# Patient Record
Sex: Female | Born: 1945 | Race: White | Hispanic: No | Marital: Single | Smoking: Former smoker
Health system: Southern US, Community
[De-identification: ages and names within clinical notes are randomized; demographics above are authoritative.]

## PROBLEM LIST (undated history)

## (undated) DIAGNOSIS — F909 Attention-deficit hyperactivity disorder, unspecified type: Secondary | ICD-10-CM

## (undated) DIAGNOSIS — E039 Hypothyroidism, unspecified: Secondary | ICD-10-CM

## (undated) DIAGNOSIS — F4310 Post-traumatic stress disorder, unspecified: Secondary | ICD-10-CM

## (undated) DIAGNOSIS — D649 Anemia, unspecified: Secondary | ICD-10-CM

## (undated) DIAGNOSIS — G709 Myoneural disorder, unspecified: Secondary | ICD-10-CM

## (undated) DIAGNOSIS — E781 Pure hyperglyceridemia: Secondary | ICD-10-CM

## (undated) DIAGNOSIS — E079 Disorder of thyroid, unspecified: Secondary | ICD-10-CM

## (undated) DIAGNOSIS — C801 Malignant (primary) neoplasm, unspecified: Secondary | ICD-10-CM

## (undated) DIAGNOSIS — F32A Depression, unspecified: Secondary | ICD-10-CM

## (undated) DIAGNOSIS — B019 Varicella without complication: Secondary | ICD-10-CM

## (undated) DIAGNOSIS — F419 Anxiety disorder, unspecified: Secondary | ICD-10-CM

## (undated) DIAGNOSIS — G4730 Sleep apnea, unspecified: Secondary | ICD-10-CM

## (undated) DIAGNOSIS — T7840XA Allergy, unspecified, initial encounter: Secondary | ICD-10-CM

## (undated) DIAGNOSIS — G629 Polyneuropathy, unspecified: Secondary | ICD-10-CM

## (undated) DIAGNOSIS — M1990 Unspecified osteoarthritis, unspecified site: Secondary | ICD-10-CM

## (undated) DIAGNOSIS — I10 Essential (primary) hypertension: Secondary | ICD-10-CM

## (undated) DIAGNOSIS — R4189 Other symptoms and signs involving cognitive functions and awareness: Secondary | ICD-10-CM

## (undated) DIAGNOSIS — H269 Unspecified cataract: Secondary | ICD-10-CM

## (undated) DIAGNOSIS — C50919 Malignant neoplasm of unspecified site of unspecified female breast: Secondary | ICD-10-CM

## (undated) DIAGNOSIS — F329 Major depressive disorder, single episode, unspecified: Secondary | ICD-10-CM

## (undated) HISTORY — DX: Depression, unspecified: F32.A

## (undated) HISTORY — PX: TONSILLECTOMY: SUR1361

## (undated) HISTORY — DX: Pure hyperglyceridemia: E78.1

## (undated) HISTORY — DX: Malignant (primary) neoplasm, unspecified: C80.1

## (undated) HISTORY — DX: Major depressive disorder, single episode, unspecified: F32.9

## (undated) HISTORY — DX: Unspecified cataract: H26.9

## (undated) HISTORY — DX: Varicella without complication: B01.9

## (undated) HISTORY — PX: EYE SURGERY: SHX253

## (undated) HISTORY — DX: Essential (primary) hypertension: I10

## (undated) HISTORY — DX: Myoneural disorder, unspecified: G70.9

## (undated) HISTORY — DX: Malignant neoplasm of unspecified site of unspecified female breast: C50.919

## (undated) HISTORY — DX: Disorder of thyroid, unspecified: E07.9

## (undated) HISTORY — DX: Polyneuropathy, unspecified: G62.9

## (undated) HISTORY — DX: Anemia, unspecified: D64.9

## (undated) HISTORY — DX: Unspecified osteoarthritis, unspecified site: M19.90

## (undated) HISTORY — DX: Sleep apnea, unspecified: G47.30

## (undated) HISTORY — PX: COLONOSCOPY: SHX174

## (undated) HISTORY — PX: CATARACT EXTRACTION W/ INTRAOCULAR LENS  IMPLANT, BILATERAL: SHX1307

## (undated) HISTORY — DX: Allergy, unspecified, initial encounter: T78.40XA

---

## 1972-10-07 HISTORY — PX: THYROIDECTOMY, PARTIAL: SHX18

## 2006-01-05 DIAGNOSIS — G4730 Sleep apnea, unspecified: Secondary | ICD-10-CM | POA: Insufficient documentation

## 2014-10-07 DIAGNOSIS — G3184 Mild cognitive impairment, so stated: Secondary | ICD-10-CM | POA: Insufficient documentation

## 2014-11-06 DIAGNOSIS — M8580 Other specified disorders of bone density and structure, unspecified site: Secondary | ICD-10-CM | POA: Diagnosis not present

## 2014-11-06 DIAGNOSIS — M419 Scoliosis, unspecified: Secondary | ICD-10-CM | POA: Diagnosis not present

## 2014-11-06 DIAGNOSIS — Z862 Personal history of diseases of the blood and blood-forming organs and certain disorders involving the immune mechanism: Secondary | ICD-10-CM | POA: Diagnosis not present

## 2014-11-06 DIAGNOSIS — I10 Essential (primary) hypertension: Secondary | ICD-10-CM | POA: Diagnosis not present

## 2014-11-09 DIAGNOSIS — F332 Major depressive disorder, recurrent severe without psychotic features: Secondary | ICD-10-CM | POA: Diagnosis not present

## 2014-11-09 DIAGNOSIS — F411 Generalized anxiety disorder: Secondary | ICD-10-CM | POA: Diagnosis not present

## 2014-11-09 DIAGNOSIS — F900 Attention-deficit hyperactivity disorder, predominantly inattentive type: Secondary | ICD-10-CM | POA: Diagnosis not present

## 2014-12-05 DIAGNOSIS — M8580 Other specified disorders of bone density and structure, unspecified site: Secondary | ICD-10-CM | POA: Diagnosis not present

## 2014-12-05 DIAGNOSIS — M419 Scoliosis, unspecified: Secondary | ICD-10-CM | POA: Diagnosis not present

## 2014-12-05 DIAGNOSIS — Z862 Personal history of diseases of the blood and blood-forming organs and certain disorders involving the immune mechanism: Secondary | ICD-10-CM | POA: Diagnosis not present

## 2014-12-05 DIAGNOSIS — I10 Essential (primary) hypertension: Secondary | ICD-10-CM | POA: Diagnosis not present

## 2015-01-03 DIAGNOSIS — M722 Plantar fascial fibromatosis: Secondary | ICD-10-CM | POA: Diagnosis not present

## 2015-01-03 DIAGNOSIS — M7740 Metatarsalgia, unspecified foot: Secondary | ICD-10-CM | POA: Diagnosis not present

## 2015-01-03 DIAGNOSIS — M216X1 Other acquired deformities of right foot: Secondary | ICD-10-CM | POA: Diagnosis not present

## 2015-01-03 DIAGNOSIS — M216X2 Other acquired deformities of left foot: Secondary | ICD-10-CM | POA: Diagnosis not present

## 2015-01-05 DIAGNOSIS — I10 Essential (primary) hypertension: Secondary | ICD-10-CM | POA: Diagnosis not present

## 2015-01-11 DIAGNOSIS — F411 Generalized anxiety disorder: Secondary | ICD-10-CM | POA: Diagnosis not present

## 2015-01-11 DIAGNOSIS — F332 Major depressive disorder, recurrent severe without psychotic features: Secondary | ICD-10-CM | POA: Diagnosis not present

## 2015-02-04 DIAGNOSIS — M8580 Other specified disorders of bone density and structure, unspecified site: Secondary | ICD-10-CM | POA: Diagnosis not present

## 2015-02-04 DIAGNOSIS — I10 Essential (primary) hypertension: Secondary | ICD-10-CM | POA: Diagnosis not present

## 2015-03-07 DIAGNOSIS — M8580 Other specified disorders of bone density and structure, unspecified site: Secondary | ICD-10-CM | POA: Diagnosis not present

## 2015-03-07 DIAGNOSIS — I10 Essential (primary) hypertension: Secondary | ICD-10-CM | POA: Diagnosis not present

## 2015-04-06 DIAGNOSIS — I10 Essential (primary) hypertension: Secondary | ICD-10-CM | POA: Diagnosis not present

## 2015-04-06 DIAGNOSIS — M8580 Other specified disorders of bone density and structure, unspecified site: Secondary | ICD-10-CM | POA: Diagnosis not present

## 2015-04-07 DIAGNOSIS — F411 Generalized anxiety disorder: Secondary | ICD-10-CM | POA: Diagnosis not present

## 2015-04-07 DIAGNOSIS — F900 Attention-deficit hyperactivity disorder, predominantly inattentive type: Secondary | ICD-10-CM | POA: Diagnosis not present

## 2015-04-07 DIAGNOSIS — F332 Major depressive disorder, recurrent severe without psychotic features: Secondary | ICD-10-CM | POA: Diagnosis not present

## 2015-05-05 DIAGNOSIS — M8580 Other specified disorders of bone density and structure, unspecified site: Secondary | ICD-10-CM | POA: Diagnosis not present

## 2015-05-05 DIAGNOSIS — I10 Essential (primary) hypertension: Secondary | ICD-10-CM | POA: Diagnosis not present

## 2015-06-07 DIAGNOSIS — M8580 Other specified disorders of bone density and structure, unspecified site: Secondary | ICD-10-CM | POA: Diagnosis not present

## 2015-06-07 DIAGNOSIS — I10 Essential (primary) hypertension: Secondary | ICD-10-CM | POA: Diagnosis not present

## 2015-06-29 DIAGNOSIS — M5116 Intervertebral disc disorders with radiculopathy, lumbar region: Secondary | ICD-10-CM | POA: Diagnosis not present

## 2015-06-29 DIAGNOSIS — Z6822 Body mass index (BMI) 22.0-22.9, adult: Secondary | ICD-10-CM | POA: Diagnosis not present

## 2015-06-29 DIAGNOSIS — E782 Mixed hyperlipidemia: Secondary | ICD-10-CM | POA: Diagnosis not present

## 2015-06-29 DIAGNOSIS — R202 Paresthesia of skin: Secondary | ICD-10-CM | POA: Diagnosis not present

## 2015-06-29 DIAGNOSIS — Z0001 Encounter for general adult medical examination with abnormal findings: Secondary | ICD-10-CM | POA: Diagnosis not present

## 2015-06-29 DIAGNOSIS — Z23 Encounter for immunization: Secondary | ICD-10-CM | POA: Diagnosis not present

## 2015-06-29 DIAGNOSIS — G4730 Sleep apnea, unspecified: Secondary | ICD-10-CM | POA: Diagnosis not present

## 2015-06-29 DIAGNOSIS — I10 Essential (primary) hypertension: Secondary | ICD-10-CM | POA: Diagnosis not present

## 2015-07-06 DIAGNOSIS — F332 Major depressive disorder, recurrent severe without psychotic features: Secondary | ICD-10-CM | POA: Diagnosis not present

## 2015-07-06 DIAGNOSIS — F411 Generalized anxiety disorder: Secondary | ICD-10-CM | POA: Diagnosis not present

## 2015-07-06 DIAGNOSIS — F900 Attention-deficit hyperactivity disorder, predominantly inattentive type: Secondary | ICD-10-CM | POA: Diagnosis not present

## 2015-07-07 DIAGNOSIS — E782 Mixed hyperlipidemia: Secondary | ICD-10-CM | POA: Diagnosis not present

## 2015-07-07 DIAGNOSIS — F411 Generalized anxiety disorder: Secondary | ICD-10-CM | POA: Diagnosis not present

## 2015-07-07 DIAGNOSIS — I10 Essential (primary) hypertension: Secondary | ICD-10-CM | POA: Diagnosis not present

## 2015-07-26 DIAGNOSIS — Z6821 Body mass index (BMI) 21.0-21.9, adult: Secondary | ICD-10-CM | POA: Diagnosis not present

## 2015-07-26 DIAGNOSIS — I10 Essential (primary) hypertension: Secondary | ICD-10-CM | POA: Diagnosis not present

## 2015-08-07 DIAGNOSIS — E782 Mixed hyperlipidemia: Secondary | ICD-10-CM | POA: Diagnosis not present

## 2015-08-07 DIAGNOSIS — F411 Generalized anxiety disorder: Secondary | ICD-10-CM | POA: Diagnosis not present

## 2015-08-07 DIAGNOSIS — I10 Essential (primary) hypertension: Secondary | ICD-10-CM | POA: Diagnosis not present

## 2015-08-10 DIAGNOSIS — F900 Attention-deficit hyperactivity disorder, predominantly inattentive type: Secondary | ICD-10-CM | POA: Diagnosis not present

## 2015-08-10 DIAGNOSIS — F332 Major depressive disorder, recurrent severe without psychotic features: Secondary | ICD-10-CM | POA: Diagnosis not present

## 2015-08-10 DIAGNOSIS — F411 Generalized anxiety disorder: Secondary | ICD-10-CM | POA: Diagnosis not present

## 2015-09-06 DIAGNOSIS — F411 Generalized anxiety disorder: Secondary | ICD-10-CM | POA: Diagnosis not present

## 2015-09-06 DIAGNOSIS — I10 Essential (primary) hypertension: Secondary | ICD-10-CM | POA: Diagnosis not present

## 2015-09-06 DIAGNOSIS — E782 Mixed hyperlipidemia: Secondary | ICD-10-CM | POA: Diagnosis not present

## 2015-09-07 DIAGNOSIS — F4310 Post-traumatic stress disorder, unspecified: Secondary | ICD-10-CM | POA: Insufficient documentation

## 2015-09-20 DIAGNOSIS — F411 Generalized anxiety disorder: Secondary | ICD-10-CM | POA: Diagnosis not present

## 2015-09-20 DIAGNOSIS — F332 Major depressive disorder, recurrent severe without psychotic features: Secondary | ICD-10-CM | POA: Diagnosis not present

## 2015-09-20 DIAGNOSIS — F900 Attention-deficit hyperactivity disorder, predominantly inattentive type: Secondary | ICD-10-CM | POA: Diagnosis not present

## 2015-11-08 DIAGNOSIS — G4730 Sleep apnea, unspecified: Secondary | ICD-10-CM | POA: Diagnosis not present

## 2015-11-08 DIAGNOSIS — E782 Mixed hyperlipidemia: Secondary | ICD-10-CM | POA: Diagnosis not present

## 2015-11-08 DIAGNOSIS — I10 Essential (primary) hypertension: Secondary | ICD-10-CM | POA: Diagnosis not present

## 2015-11-22 DIAGNOSIS — H35379 Puckering of macula, unspecified eye: Secondary | ICD-10-CM | POA: Diagnosis not present

## 2015-12-05 DIAGNOSIS — I10 Essential (primary) hypertension: Secondary | ICD-10-CM | POA: Diagnosis not present

## 2015-12-05 DIAGNOSIS — F411 Generalized anxiety disorder: Secondary | ICD-10-CM | POA: Diagnosis not present

## 2015-12-05 DIAGNOSIS — E782 Mixed hyperlipidemia: Secondary | ICD-10-CM | POA: Diagnosis not present

## 2015-12-14 DIAGNOSIS — F332 Major depressive disorder, recurrent severe without psychotic features: Secondary | ICD-10-CM | POA: Diagnosis not present

## 2015-12-14 DIAGNOSIS — F411 Generalized anxiety disorder: Secondary | ICD-10-CM | POA: Diagnosis not present

## 2015-12-14 DIAGNOSIS — F900 Attention-deficit hyperactivity disorder, predominantly inattentive type: Secondary | ICD-10-CM | POA: Diagnosis not present

## 2016-01-05 DIAGNOSIS — F411 Generalized anxiety disorder: Secondary | ICD-10-CM | POA: Diagnosis not present

## 2016-01-05 DIAGNOSIS — I10 Essential (primary) hypertension: Secondary | ICD-10-CM | POA: Diagnosis not present

## 2016-01-05 DIAGNOSIS — E782 Mixed hyperlipidemia: Secondary | ICD-10-CM | POA: Diagnosis not present

## 2016-02-04 DIAGNOSIS — I10 Essential (primary) hypertension: Secondary | ICD-10-CM | POA: Diagnosis not present

## 2016-02-04 DIAGNOSIS — F411 Generalized anxiety disorder: Secondary | ICD-10-CM | POA: Diagnosis not present

## 2016-02-04 DIAGNOSIS — E782 Mixed hyperlipidemia: Secondary | ICD-10-CM | POA: Diagnosis not present

## 2016-02-20 ENCOUNTER — Encounter: Payer: Self-pay | Admitting: Internal Medicine

## 2016-02-20 ENCOUNTER — Ambulatory Visit (INDEPENDENT_AMBULATORY_CARE_PROVIDER_SITE_OTHER): Payer: Medicare Other | Admitting: Internal Medicine

## 2016-02-20 VITALS — BP 108/64 | HR 61 | Temp 97.7°F | Ht 60.0 in | Wt 112.8 lb

## 2016-02-20 DIAGNOSIS — G629 Polyneuropathy, unspecified: Secondary | ICD-10-CM | POA: Insufficient documentation

## 2016-02-20 DIAGNOSIS — G4700 Insomnia, unspecified: Secondary | ICD-10-CM

## 2016-02-20 DIAGNOSIS — F909 Attention-deficit hyperactivity disorder, unspecified type: Secondary | ICD-10-CM | POA: Diagnosis not present

## 2016-02-20 DIAGNOSIS — F32A Depression, unspecified: Secondary | ICD-10-CM | POA: Insufficient documentation

## 2016-02-20 DIAGNOSIS — E785 Hyperlipidemia, unspecified: Secondary | ICD-10-CM | POA: Diagnosis not present

## 2016-02-20 DIAGNOSIS — I10 Essential (primary) hypertension: Secondary | ICD-10-CM

## 2016-02-20 DIAGNOSIS — F988 Other specified behavioral and emotional disorders with onset usually occurring in childhood and adolescence: Secondary | ICD-10-CM

## 2016-02-20 DIAGNOSIS — F329 Major depressive disorder, single episode, unspecified: Secondary | ICD-10-CM | POA: Insufficient documentation

## 2016-02-20 DIAGNOSIS — J302 Other seasonal allergic rhinitis: Secondary | ICD-10-CM

## 2016-02-20 DIAGNOSIS — F418 Other specified anxiety disorders: Secondary | ICD-10-CM

## 2016-02-20 DIAGNOSIS — F419 Anxiety disorder, unspecified: Secondary | ICD-10-CM

## 2016-02-20 DIAGNOSIS — G588 Other specified mononeuropathies: Secondary | ICD-10-CM

## 2016-02-20 MED ORDER — EZETIMIBE 10 MG PO TABS: 10.0000 mg | ORAL_TABLET | Freq: Every day | ORAL | Status: DC

## 2016-02-20 MED ORDER — LISINOPRIL 20 MG PO TABS: 20.0000 mg | ORAL_TABLET | Freq: Every day | ORAL | Status: DC

## 2016-02-20 NOTE — Assessment & Plan Note (Signed)
Will get recent Lipid profile from previous PCP Continue Zetia, Omega 3 Encouraged her to consume a low fat diet
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
## 2016-02-20 NOTE — Assessment & Plan Note (Signed)
She wants to continue Metanx If persist, we can refer her back to neurology in the area

## 2016-02-20 NOTE — Progress Notes (Signed)
Pre visit review using our clinic review tool, if applicable. No additional management support is needed unless otherwise documented below in the visit note. 

## 2016-02-20 NOTE — Assessment & Plan Note (Signed)
Will request previous records from psych before filling Adderall

## 2016-02-20 NOTE — Assessment & Plan Note (Signed)
Continue Allegra and [employer]

## 2016-02-20 NOTE — Assessment & Plan Note (Signed)
Advised her to continue Trazadone prn

## 2016-02-20 NOTE — Assessment & Plan Note (Signed)
Controlled on Buspar Seems to be adjusting well here Will continue to monitor

## 2016-02-20 NOTE — Patient Instructions (Signed)

## 2016-02-20 NOTE — Assessment & Plan Note (Signed)
Well controlled on Lisinopril Consider wean if she remains on the low end

## 2016-03-01 ENCOUNTER — Telehealth: Payer: Self-pay

## 2016-03-01 ENCOUNTER — Ambulatory Visit (HOSPITAL_COMMUNITY)
Admission: EM | Admit: 2016-03-01 | Discharge: 2016-03-01 | Disposition: A | Discharge status: Home / Self Care | Payer: Medicare Other | Attending: Emergency Medicine | Admitting: Emergency Medicine

## 2016-03-01 ENCOUNTER — Encounter (HOSPITAL_COMMUNITY): Payer: Self-pay | Admitting: *Deleted

## 2016-03-01 ENCOUNTER — Ambulatory Visit (INDEPENDENT_AMBULATORY_CARE_PROVIDER_SITE_OTHER): Payer: Medicare Other

## 2016-03-01 ENCOUNTER — Telehealth: Payer: Self-pay | Admitting: Primary Care

## 2016-03-01 DIAGNOSIS — M79672 Pain in left foot: Secondary | ICD-10-CM

## 2016-03-01 DIAGNOSIS — S93602A Unspecified sprain of left foot, initial encounter: Secondary | ICD-10-CM

## 2016-03-01 DIAGNOSIS — M7989 Other specified soft tissue disorders: Secondary | ICD-10-CM | POA: Diagnosis not present

## 2016-03-01 NOTE — Telephone Encounter (Signed)
Pt has plans to go out of town 03/02/16 and does not want to go to [facility]. Pt request contact # for [facility]; gave pt [phone]. Pt said she most likely will go to [facility] this evening.

## 2016-03-01 NOTE — Discharge Instructions (Signed)
Foot Sprain  The xrays look good. Your exam suggest mild sprain. Suggest elevation and icing about 20 min every 2-3 hours. Wear your boot for the next week, when you are up and on the foot. Not to bed. If you are not improving then please f/u with Dr. Doran Durand. May use Aleve 2 tablets in am and 2 in pm for the next few days for pain and inflammation. Hope it feels better soon.   A foot sprain is an injury to one of the strong bands of tissue (ligaments) that connect and support the many bones in your feet. The ligament can be stretched too much or it can tear. A tear can be either partial or complete. The severity of the sprain depends on how much of the ligament was damaged or torn. CAUSES A foot sprain is usually caused by suddenly twisting or pivoting your foot. RISK FACTORS This injury is more likely to occur in people who:  Play a sport, such as basketball or football.  Exercise or play a sport without warming up.  Start a new workout or sport.  Suddenly increase how long or hard they exercise or play a sport. SYMPTOMS Symptoms of this condition start soon after an injury and include:  Pain, especially in the arch of the foot.  Bruising.  Swelling.  Inability to walk or use the foot to support body weight. DIAGNOSIS This condition is diagnosed with a medical history and physical exam. You may also have imaging tests, such as:  X-rays to make sure there are no broken bones (fractures).  MRI to see if the ligament has torn. TREATMENT Treatment varies depending on the severity of your sprain. Mild sprains can be treated with rest, ice, compression, and elevation (RICE). If your ligament is overstretched or partially torn, treatment usually involves keeping your foot in a fixed position (immobilization) for a period of time. To help you do this, your health care provider will apply a bandage, splint, or walking boot to keep your foot from moving until it heals. You may also be  advised to use crutches or a scooter for a few weeks to avoid bearing weight on your foot while it is healing. If your ligament is fully torn, you may need surgery to reconnect the ligament to the bone. After surgery, a cast or splint will be applied and will need to stay on your foot while it heals. Your health care provider may also suggest exercises or physical therapy to strengthen your foot. HOME CARE INSTRUCTIONS If You Have a Bandage, Splint, or Walking Boot:  Wear it as directed by your health care provider. Remove it only as directed by your health care provider.  Loosen the bandage, splint, or walking boot if your toes become numb and tingle, or if they turn cold and blue. Bathing  If your health care provider approves bathing and showering, cover the bandage or splint with a watertight plastic bag to protect it from water. Do not let the bandage or splint get wet. Managing Pain, Stiffness, and Swelling   If directed, apply ice to the injured area:  Put ice in a plastic bag.  Place a towel between your skin and the bag.  Leave the ice on for 20 minutes, 2-3 times per day.  Move your toes often to avoid stiffness and to lessen swelling.  Raise (elevate) the injured area above the level of your heart while you are sitting or lying down. Driving  Do not drive or  operate heavy machinery while taking pain medicine.  Do not drive while wearing a bandage, splint, or walking boot on a foot that you use for driving. Activity  Rest as directed by your health care provider.  Do not use the injured foot to support your body weight until your health care provider says that you can. Use crutches or other supportive devices as directed by your health care provider.  Ask your health care provider what activities are safe for you. Gradually increase how much and how far you walk until your health care provider says it is safe to return to full activity.  Do any exercise or physical  therapy as directed by your health care provider. General Instructions  If a splint was applied, do not put pressure on any part of it until it is fully hardened. This may take several hours.  Take medicines only as directed by your health care provider. These include over-the-counter medicines and prescription medicines.  Keep all follow-up visits as directed by your health care provider. This is important.  When you can walk without pain, wear supportive shoes that have stiff soles. Do not wear flip-flops, and do not walk barefoot. SEEK MEDICAL CARE IF:  Your pain is not controlled with medicine.  Your bruising or swelling gets worse or does not get better with treatment.  Your splint or walking boot is damaged. SEEK IMMEDIATE MEDICAL CARE IF:  Your foot is numb or blue.  Your foot feels colder than normal.   This information is not intended to replace advice given to you by your health care provider. Make sure you discuss any questions you have with your health care provider.   Document Released: 03/15/2002 Document Revised: 02/07/2015 Document Reviewed: 07/27/2014 Elsevier Interactive Patient Education [employer].

## 2016-03-01 NOTE — ED Provider Notes (Signed)
CSN: [id number]     Arrival date & time 03/01/16  1739 History   First MD Initiated Contact with Patient 03/01/16 1755     Chief Complaint  Patient presents with  . Foot Pain   (Consider location/radiation/quality/duration/timing/severity/associated sxs/prior Treatment) HPI Comments: Patient is a 70 yo female who presents with left foot pain since walking on the treadmill last night. She carries a history of peripheral neuropathy and therefore her sensation in her feet are diminished. She reports sudden pain along the forefoot that has no improved today. No noted swelling. No erythema or warmth. No ankle pain. Pain with ambulation.   Patient is a 70 y.o. female presenting with lower extremity pain. The history is provided by the patient.  Foot Pain    Past Medical History  Diagnosis Date  . Hypertriglyceridemia   . Chicken pox   . Depression   . Hypertension    Past Surgical History  Procedure Laterality Date  . Thyroidectomy, partial  1974   Family History  Problem Relation Age of Onset  . Arthritis Mother   . Heart disease Mother   . Heart disease Father   . Mental illness Maternal Uncle   . Arthritis Maternal Grandmother   . Heart disease Maternal Grandmother   . Alcohol abuse Maternal Grandfather   . Heart disease Maternal Grandfather   . Heart disease Paternal Grandmother   . Heart disease Paternal Grandfather    Social History  Substance Use Topics  . Smoking status: Never Smoker   . Smokeless tobacco: None  . Alcohol Use: No   OB History    No data available     Review of Systems  Skin: Negative for color change.    Allergies  Review of patient's allergies indicates no known allergies.  Home Medications   Prior to Admission medications   Medication Sig Start Date End Date Taking? Authorizing Provider  Alpha-Lipoic Acid 600 MG CAPS Take 1 capsule by mouth daily.    Historical Provider, MD  amphetamine-dextroamphetamine (ADDERALL) 20 MG tablet Take  10-20 mg by mouth daily.    Historical Provider, MD  busPIRone (BUSPAR) 30 MG tablet Take 30 mg by mouth 2 (two) times daily.    Historical Provider, MD  Calcium Carb-Cholecalciferol (CALCIUM PLUS VITAMIN D3 PO) Take 5,000 mg by mouth daily.    Historical Provider, MD  Calcium Carbonate (CALCIUM 600 PO) Take 1 tablet by mouth 2 (two) times daily.    Historical Provider, MD  diphenhydrAMINE (BENADRYL) 25 MG tablet Take 25 mg by mouth as needed.    Historical Provider, MD  Docosahexaenoic Acid (DHA OMEGA 3 PO) Take 1,000 mg by mouth daily.    Historical Provider, MD  ezetimibe (ZETIA) 10 MG tablet Take 1 tablet (10 mg total) by mouth daily. 02/20/16   Jearld Fenton, NP  fexofenadine (ALLEGRA) 180 MG tablet Take 180 mg by mouth daily.    Historical Provider, MD  Glucosamine HCl-MSM 750-750 MG TABS Take 1 tablet by mouth 2 (two) times daily.    Historical Provider, MD  L-Methylfolate-Algae-B12-B6 (METANX PO) Take 1 each by mouth daily.    Historical Provider, MD  lisinopril (PRINIVIL,ZESTRIL) 20 MG tablet Take 1 tablet (20 mg total) by mouth daily. 02/20/16   Jearld Fenton, NP  MAGNESIUM MALATE PO Take 1 capsule by mouth at bedtime and may repeat dose one time if needed.    Historical Provider, MD  OVER THE COUNTER MEDICATION Take 1,000 mg by mouth daily. Tumeric  Historical Provider, MD  OVER THE COUNTER MEDICATION Take 2 capsules by mouth daily. Reservatrol    Historical Provider, MD  Specialty Vitamins Products (AMINOBRAIN PO) Take 6 tablets by mouth daily.    Historical Provider, MD  traZODone (DESYREL) 50 MG tablet Take 50 mg by mouth at bedtime.    Historical Provider, MD   Meds Ordered and Administered this Visit  Medications - No data to display  BP 138/81 mmHg  Pulse 61  Temp(Src) 98.1 F (36.7 C) (Oral)  Resp 12  SpO2 97% No data found.   Physical Exam  Constitutional: She is oriented to person, place, and time. She appears well-developed and well-nourished. No distress.   Musculoskeletal: She exhibits tenderness. She exhibits no edema.  Left foot without swelling, erythema or warmth. Tenderness to palpation along the forefoot, specifically along the 4th -5th MTP. Full ROM in the ankles and toes. DP and PT pulses are intact  Neurological: She is alert and oriented to person, place, and time.  Skin: Skin is warm and dry. No rash noted. She is not diaphoretic.  Psychiatric: Her behavior is normal.  Nursing note and vitals reviewed.   ED Course  Procedures (including critical care time)  Labs Review Labs Reviewed - No data to display  Imaging Review Dg Foot Complete Left  03/01/2016  CLINICAL DATA:  Left foot pain, swelling EXAM: LEFT FOOT - COMPLETE 3+ VIEW COMPARISON:  None. FINDINGS: There is no evidence of fracture or dislocation. There is hallux valgus. The joint spaces are maintained. Soft tissues are unremarkable. IMPRESSION: No acute osseous injury of the left foot. Electronically Signed   By: Kathreen Devoid   On: 03/01/2016 18:48     Visual Acuity Review  Right Eye Distance:   Left Eye Distance:   Bilateral Distance:    Right Eye Near:   Left Eye Near:    Bilateral Near:         MDM   1. Foot sprain, left, initial encounter   2. Foot pain, left    Normal xrays. Suggest sprain. Ice, elevate. She has a cam walker at home she may use over the next week when up and ambulatory. Suggest Aleve as needed for pain and inflammation. F/U with Ortho if not improving.       Bjorn Pippin, PA-C 03/01/16 [phone]

## 2016-03-01 NOTE — Telephone Encounter (Signed)
Noted. Please check on her Tuesday once our office opens.

## 2016-03-01 NOTE — ED Notes (Signed)
Pt  Reports  Last  Pm  She  Was  On the  tredmill and  Injured her l  Foot   She  Reports  Pain  Present      she  denys  Twisting  It  Or  Falling on it   She  Ambulated  To room  With a  Steady  Fluid  Gait

## 2016-03-01 NOTE — Telephone Encounter (Signed)
[facility]  Patient Name: Angela Murphy  DOB: 02-01-1946    Initial Comment has some foot numbness, left ankle hurts. went to exercise class last night, foot went really numb, and injured ankle somehow. just did initial intake may 16th    Nurse Assessment  Nurse: Wayne Sever, RN, Tillie Rung Date/Time Eilene Ghazi Time): 03/01/2016 2:28:30 PM  Confirm and document reason for call. If symptomatic, describe symptoms. You must click the next button to save text entered. ---Caller states she hurt her left/foot ankle at exercise class last night. She thinks she over worked it. She states she has neuropathy so her foot always feels half numb.  Has the patient traveled out of the country within the last 30 days? ---Not Applicable  Does the patient have any new or worsening symptoms? ---Yes  Will a triage be completed? ---Yes  Related visit to physician within the last 2 weeks? ---No  Does the PT have any chronic conditions? (i.e. diabetes, asthma, etc.) ---Yes  List chronic conditions. ---Neuropathy, Low Energy  Is this a behavioral health or substance abuse call? ---No     Guidelines    Guideline Title Affirmed Question Affirmed Notes  Foot and Ankle Injury [1] Limp when walking AND [2] due to a twisted ankle or foot    Final Disposition User   See Physician within 24 Hours [place], RN, Tillie Rung    Comments  Unable to find caller an appointment, she states she is going to rest and not go to UC. She already had plans and did not want [place] appointment for Saturday. Advised her she needs it checked within 24 hours, but she was not sure if she would   Referrals  REFERRED TO PCP OFFICE   Disagree/Comply: Disagree  Disagree/Comply Reason: Disagree with instructions

## 2016-03-01 NOTE — Telephone Encounter (Signed)
PA submitted through [email or web address]--[employer]--awaiting response

## 2016-03-05 NOTE — Telephone Encounter (Signed)
I reviewed the note in the EMR. A podiatrist would likely not be the best person to see for a sprain. Does she want referral to an orthopedist?

## 2016-03-05 NOTE — Telephone Encounter (Signed)
Before I refer her, I need to know what she was diagnosed at [facility] with.

## 2016-03-05 NOTE — Telephone Encounter (Signed)
Called and check up on patient. Patient stated that she went to urgent care and now wearing a boot for support. She wanted to know if she can get a referral to a podiatrist since this something similar she had before. Patient also had new patient appointment with Webb Silversmith on 02/20/2016.

## 2016-03-06 NOTE — Telephone Encounter (Signed)
Left detailed msg on VM per HIPAA  

## 2016-03-07 ENCOUNTER — Other Ambulatory Visit: Payer: Self-pay

## 2016-03-07 ENCOUNTER — Other Ambulatory Visit: Payer: Self-pay | Admitting: Internal Medicine

## 2016-03-07 DIAGNOSIS — S93402D Sprain of unspecified ligament of left ankle, subsequent encounter: Secondary | ICD-10-CM

## 2016-03-07 MED ORDER — BUSPIRONE HCL 30 MG PO TABS: 30.0000 mg | ORAL_TABLET | Freq: Two times a day (BID) | ORAL | Status: DC

## 2016-03-07 NOTE — Telephone Encounter (Signed)
Referral placed.

## 2016-03-07 NOTE — Telephone Encounter (Signed)
New pt, medication not filled by you yet--please advise if okay to refill--pt request 90 day supply

## 2016-03-07 NOTE — Telephone Encounter (Signed)
Medication sent electronically 

## 2016-03-07 NOTE — Telephone Encounter (Signed)
Case ID: [id number]  N/A on May 26  This medication is on your plan's list of covered drugs. Prior authorization is not required at this time. If your pharmacy has questions regarding the processing of your prescription, please have them call the [employer] pharmacy help desk at ([phone]. For additional information, the member can contact Member Services by calling the number on the back of their ID Card.  I called the pharmacy and conveyed response as stated above from [employer]---they said they will call the help desk   Spoke to pt and she is aware

## 2016-03-07 NOTE — Telephone Encounter (Signed)
Pt would like the referral to see ortho

## 2016-03-12 DIAGNOSIS — M84375A Stress fracture, left foot, initial encounter for fracture: Secondary | ICD-10-CM | POA: Diagnosis not present

## 2016-03-25 ENCOUNTER — Telehealth: Payer: Self-pay

## 2016-03-25 NOTE — Telephone Encounter (Signed)
Pt left v/m requesting referral to psychiatrist; pt is taking generic adderall; pt having problems working due to pt cannot focus and having problems concentrating. Pt request cb.

## 2016-03-26 ENCOUNTER — Encounter: Payer: Self-pay | Admitting: Internal Medicine

## 2016-03-26 ENCOUNTER — Other Ambulatory Visit: Payer: Self-pay | Admitting: Internal Medicine

## 2016-03-26 DIAGNOSIS — F988 Other specified behavioral and emotional disorders with onset usually occurring in childhood and adolescence: Secondary | ICD-10-CM

## 2016-03-26 NOTE — Telephone Encounter (Signed)
Referral placed.

## 2016-03-26 NOTE — Telephone Encounter (Signed)
Pt placed on [facility] WQ to scheduled with Dr. Rea College for formal ADD testing. Gave pt information and she will call and schedule

## 2016-03-27 DIAGNOSIS — F411 Generalized anxiety disorder: Secondary | ICD-10-CM | POA: Diagnosis not present

## 2016-03-28 DIAGNOSIS — M25572 Pain in left ankle and joints of left foot: Secondary | ICD-10-CM | POA: Diagnosis not present

## 2016-04-04 DIAGNOSIS — F411 Generalized anxiety disorder: Secondary | ICD-10-CM | POA: Diagnosis not present

## 2016-04-10 ENCOUNTER — Ambulatory Visit: Payer: Medicare Other | Admitting: Psychology

## 2016-04-10 DIAGNOSIS — M25572 Pain in left ankle and joints of left foot: Secondary | ICD-10-CM | POA: Diagnosis not present

## 2016-04-17 ENCOUNTER — Ambulatory Visit (INDEPENDENT_AMBULATORY_CARE_PROVIDER_SITE_OTHER): Payer: Medicare Other | Admitting: Psychology

## 2016-04-17 DIAGNOSIS — F4323 Adjustment disorder with mixed anxiety and depressed mood: Secondary | ICD-10-CM

## 2016-04-30 ENCOUNTER — Encounter: Payer: Self-pay | Admitting: Internal Medicine

## 2016-04-30 ENCOUNTER — Ambulatory Visit: Payer: Medicare Other | Admitting: Psychology

## 2016-04-30 DIAGNOSIS — G4733 Obstructive sleep apnea (adult) (pediatric): Secondary | ICD-10-CM

## 2016-05-01 NOTE — Telephone Encounter (Signed)
When I looked up Dr's name, the only place that came up is the office that is now closed. No new information or different phone number was seen that would be associated with him that I could see

## 2016-05-03 ENCOUNTER — Ambulatory Visit (INDEPENDENT_AMBULATORY_CARE_PROVIDER_SITE_OTHER): Payer: Medicare Other | Admitting: Psychology

## 2016-05-03 DIAGNOSIS — F4323 Adjustment disorder with mixed anxiety and depressed mood: Secondary | ICD-10-CM

## 2016-05-13 ENCOUNTER — Ambulatory Visit (INDEPENDENT_AMBULATORY_CARE_PROVIDER_SITE_OTHER): Payer: Medicare Other | Admitting: Internal Medicine

## 2016-05-13 ENCOUNTER — Encounter: Payer: Self-pay | Admitting: Internal Medicine

## 2016-05-13 VITALS — BP 120/80 | HR 68 | Ht 61.0 in | Wt 121.8 lb

## 2016-05-13 DIAGNOSIS — G4719 Other hypersomnia: Secondary | ICD-10-CM | POA: Diagnosis not present

## 2016-05-13 DIAGNOSIS — G4733 Obstructive sleep apnea (adult) (pediatric): Secondary | ICD-10-CM

## 2016-05-13 DIAGNOSIS — F411 Generalized anxiety disorder: Secondary | ICD-10-CM | POA: Diagnosis not present

## 2016-05-13 NOTE — Patient Instructions (Addendum)
--  Will order new PAP supplies for your machine.  --Repair or replace Bipap machine.

## 2016-05-13 NOTE — Progress Notes (Signed)
[name]      Assessment and Plan:  Obstructive sleep apnea. -Known obstructive sleep apnea, first diagnosed in 2002, initially treated with a dental device. -Subsequently switched to BiPAP and 2011, has been on this machine since then, notes that she is having trouble machine. It is excessively loud-we will place order to repair or replace. -We'll place an order for new Supplies.  Essential hypertension. -Sleep apnea can worsen, hypertension, there were discussed importance of adequately treating her sleep apnea to help control her hypertension.  Date: 05/13/2016  MRN# [id number] Angela Murphy December 24, 1945  Referring Physician:   Kristabella Murphy is a 70 y.o. old female seen in consultation for chief complaint of:    Chief Complaint  Patient presents with  . sleep consult    per [facility]. pt currently wears bipap avg 8hr nightly. occ wakes during the night and has trouble falling back alseep. EPWORTH:9.    HPI:   Patient is a 70 year old female with a known history of obstructive sleep apnea. She typically goes to bed between 10 and 11 PM, she falls asleep in about 30 minutes. She usually gets out of bed between 7 and 8 AM.  She is currently on BiPAP, uncertain of the pressure, she does not have access to her records. She was married in 2002, and her husband noted that that she stopped breathing in her sleep. She was intially diagnosed back then in 2002 when she lived in [place], [place]. She did not like it, so she got a dental appliance and did well with it, she was no longer snoring, and was no longer sleepy. She used that until she moved to [place] in 2010, she was having a lot of teeth pain at that time, so she saw a sleep specialist she had another sleep study. She was told that she needed to be on PAP after a study, and was started on Bipap in 2011. She is wearing it every night for the whole night, however she does not like it because it  makes noise and does not like the tube because she gets tangled up in it.       PMHX:   Past Medical History:  Diagnosis Date  . Chicken pox   . Depression   . Hypertension   . Hypertriglyceridemia    Surgical Hx:  Past Surgical History:  Procedure Laterality Date  . THYROIDECTOMY, PARTIAL  1974   Family Hx:  Family History  Problem Relation Age of Onset  . Arthritis Mother   . Heart disease Mother   . Heart disease Father   . Mental illness Maternal Uncle   . Arthritis Maternal Grandmother   . Heart disease Maternal Grandmother   . Alcohol abuse Maternal Grandfather   . Heart disease Maternal Grandfather   . Heart disease Paternal Grandmother   . Heart disease Paternal Grandfather    Social Hx:   Social History  Substance Use Topics  . Smoking status: Never Smoker  . Smokeless tobacco: Never Used  . Alcohol use No   Medication:   Medications reviewed, include Adderall, buspirone, Allegra, trazodone.   Allergies:  Review of patient's allergies indicates no known allergies.  Review of Systems: Gen:  Denies  fever, sweats, chills HEENT: Denies blurred vision, double vision. Bleeds. Cvc:  No dizziness, chest pain. Resp:   Denies cough or sputum production. Gi: Denies swallowing difficulty, stomach pain. Gu:  Denies bladder incontinence, burning urine Ext:   No Joint pain, stiffness.  Skin: No skin rash,  hives  Endoc:  No polyuria, polydipsia. Psych: No depression, insomnia. Other:  All other systems were reviewed with the patient and were negative other that what is mentioned in the HPI.   Physical Examination:   VS: BP 120/80 (BP Location: Right Arm, Cuff Size: Large)   Pulse 68   Ht 5\' 1"  (1.549 m)   Wt 121 lb 12.8 oz (55.2 kg)   SpO2 97%   BMI 23.01 kg/m   General Appearance: No distress  Neuro:without focal findings,  speech normal,  HEENT: PERRLA, EOM intact.   Pulmonary: normal breath sounds, No wheezing.  CardiovascularNormal S1,S2.  No  m/r/g.   Abdomen: Benign, Soft, non-tender. Renal:  No costovertebral tenderness  GU:  No performed at this time. Endoc: No evident thyromegaly, no signs of acromegaly. Skin:   warm, no rashes, no ecchymosis  Extremities: normal, no cyanosis, clubbing.  Other findings:    LABORATORY PANEL:   CBC No results for input(s): WBC, HGB, HCT, PLT in the last 168 hours. ------------------------------------------------------------------------------------------------------------------  Chemistries  No results for input(s): NA, K, CL, CO2, GLUCOSE, BUN, CREATININE, CALCIUM, MG, AST, ALT, ALKPHOS, BILITOT in the last 168 hours.  Invalid input(s): GFRCGP ------------------------------------------------------------------------------------------------------------------  Cardiac Enzymes No results for input(s): TROPONINI in the last 168 hours. ------------------------------------------------------------  RADIOLOGY:  No results found.     Thank  you for the consultation and for allowing [facility] to assist in the care of your patient. Our recommendations are noted above.  Please contact us if we can be of further service.   Marda Stalker, MD.  Board Certified in Internal Medicine, Pulmonary Medicine, [place], and Sleep Medicine.  [facility] Office Number: [phone]  Patricia Pesa, M.D.  Vilinda Boehringer, M.D.  Merton Border, M.D  05/13/2016

## 2016-05-24 ENCOUNTER — Ambulatory Visit (INDEPENDENT_AMBULATORY_CARE_PROVIDER_SITE_OTHER): Payer: Medicare Other | Admitting: Psychology

## 2016-05-24 DIAGNOSIS — F331 Major depressive disorder, recurrent, moderate: Secondary | ICD-10-CM

## 2016-05-24 DIAGNOSIS — F4323 Adjustment disorder with mixed anxiety and depressed mood: Secondary | ICD-10-CM

## 2016-05-31 ENCOUNTER — Ambulatory Visit: Payer: Medicare Other | Admitting: Psychology

## 2016-06-14 ENCOUNTER — Encounter: Payer: Self-pay | Admitting: Internal Medicine

## 2016-06-20 ENCOUNTER — Ambulatory Visit: Payer: Medicare Other | Attending: Pulmonary Disease

## 2016-06-20 DIAGNOSIS — R0683 Snoring: Secondary | ICD-10-CM | POA: Insufficient documentation

## 2016-06-20 DIAGNOSIS — G4730 Sleep apnea, unspecified: Secondary | ICD-10-CM | POA: Diagnosis present

## 2016-06-20 DIAGNOSIS — G4733 Obstructive sleep apnea (adult) (pediatric): Secondary | ICD-10-CM | POA: Insufficient documentation

## 2016-06-25 ENCOUNTER — Telehealth: Payer: Self-pay | Admitting: *Deleted

## 2016-06-25 DIAGNOSIS — G4733 Obstructive sleep apnea (adult) (pediatric): Secondary | ICD-10-CM

## 2016-06-25 NOTE — Telephone Encounter (Signed)
Pt informed of sleep study results. Order placed for titration study.

## 2016-06-28 ENCOUNTER — Ambulatory Visit (INDEPENDENT_AMBULATORY_CARE_PROVIDER_SITE_OTHER): Payer: Medicare Other | Admitting: Psychology

## 2016-06-28 DIAGNOSIS — F331 Major depressive disorder, recurrent, moderate: Secondary | ICD-10-CM | POA: Diagnosis not present

## 2016-06-28 DIAGNOSIS — F4323 Adjustment disorder with mixed anxiety and depressed mood: Secondary | ICD-10-CM

## 2016-07-03 ENCOUNTER — Ambulatory Visit: Payer: Medicare Other | Attending: Internal Medicine

## 2016-07-03 DIAGNOSIS — G4733 Obstructive sleep apnea (adult) (pediatric): Secondary | ICD-10-CM

## 2016-07-03 DIAGNOSIS — I10 Essential (primary) hypertension: Secondary | ICD-10-CM | POA: Diagnosis not present

## 2016-07-05 ENCOUNTER — Encounter: Payer: Self-pay | Admitting: Internal Medicine

## 2016-07-12 ENCOUNTER — Ambulatory Visit (INDEPENDENT_AMBULATORY_CARE_PROVIDER_SITE_OTHER): Payer: Medicare Other | Admitting: Psychology

## 2016-07-12 ENCOUNTER — Telehealth: Payer: Self-pay | Admitting: *Deleted

## 2016-07-12 DIAGNOSIS — F4323 Adjustment disorder with mixed anxiety and depressed mood: Secondary | ICD-10-CM

## 2016-07-12 DIAGNOSIS — G4733 Obstructive sleep apnea (adult) (pediatric): Secondary | ICD-10-CM

## 2016-07-12 NOTE — Telephone Encounter (Signed)
-----   Message from Laverle Hobby, MD sent at 07/11/2016 11:46 AM EDT ----- Regarding: RE: titration study Correct.  ----- Message ----- From: Renelda Mom, LPN Sent: [id number]  10:15 AM To: Laverle Hobby, MD Subject: RE: titration study                            I got IPAP of 5 EPAP of 10. Do you want the pressure setting at 5?  Thanks, Misty ----- Message ----- From: Laverle Hobby, MD Sent: 07/10/2016   3:16 PM To: Renelda Mom, LPN Subject: titration study                                Based on titration study, patient should be started on bipap at 10/5

## 2016-07-12 NOTE — Telephone Encounter (Signed)
Order placed for BiPAP machine. Pt informed. Nothing further needed.

## 2016-07-19 DIAGNOSIS — G4733 Obstructive sleep apnea (adult) (pediatric): Secondary | ICD-10-CM | POA: Diagnosis not present

## 2016-07-26 ENCOUNTER — Ambulatory Visit (INDEPENDENT_AMBULATORY_CARE_PROVIDER_SITE_OTHER): Payer: Medicare Other | Admitting: Psychology

## 2016-07-26 DIAGNOSIS — F4323 Adjustment disorder with mixed anxiety and depressed mood: Secondary | ICD-10-CM | POA: Diagnosis not present

## 2016-07-26 DIAGNOSIS — F331 Major depressive disorder, recurrent, moderate: Secondary | ICD-10-CM

## 2016-08-01 ENCOUNTER — Other Ambulatory Visit: Payer: Self-pay

## 2016-08-01 ENCOUNTER — Telehealth: Payer: Self-pay

## 2016-08-01 NOTE — Telephone Encounter (Signed)
Opened med refill in error.

## 2016-08-01 NOTE — Telephone Encounter (Signed)
Patient is requesting a new R/X for her Adderall 20mg  tablets for a 30 day supply.  As she was stating that this was a new R/X for [place] to prescribe, I wanted to send as a phone note for you to review.    She stated she last had it filled several months ago by a doctor in [place] who gave her a large quantity to hold her over until we could start filling.    Please advise and fill as appropriate.  Thanks.

## 2016-08-02 ENCOUNTER — Ambulatory Visit: Payer: Medicare Other | Admitting: Psychology

## 2016-08-02 NOTE — Telephone Encounter (Signed)
I spoke to pt and she states she thought she did sign a medical records release, I do not see copy in chart. Pt states she has been seeing Dr Rexene Edison and a therapist currently--I let pt know about the reason for the Psych appt as an evaluation in requested before Rx can be filled...the patient was very upset and stated that "this is ridiculous" because she has been on the medication for years and she will not be able to function without it...the patient states that she only have 7 pills left--please advise

## 2016-08-02 NOTE — Telephone Encounter (Signed)
Mel- I have not seen her records from psychiatry. Did we ever send a release of medical records?

## 2016-08-05 ENCOUNTER — Encounter: Payer: Self-pay | Admitting: Internal Medicine

## 2016-08-05 NOTE — Telephone Encounter (Signed)
I called her previous psych and they stated it could take a couple of days for records to be sent after receipt of med rec release

## 2016-08-05 NOTE — Telephone Encounter (Signed)
Mel has a phone note in regards to this

## 2016-08-05 NOTE — Telephone Encounter (Signed)
Patient left a voicemail that she is upset that she has not heard anything back from you about the refill on her Adderall. Patient requested a call back as soon as possible to let her know what is going on with this. Patient stated that she wants to make sure that you are working on getting her the refill.

## 2016-08-05 NOTE — Telephone Encounter (Signed)
I am sorry that she has been on the medications for years and is about to be out. But it is my policy that I have to have records that show diagnosis of ADD from psychiatry not PCP. If she wants to proceed with psych referral, that is fine. If not, maybe she can try calling her previous PCP for additional refills.

## 2016-08-05 NOTE — Telephone Encounter (Signed)
Left detailed msg on VM per HIPAA Pt had a psych referral for evaluation but decided not to schedule appt. Pt was advised at [facility] that [facility] would not pick up refilling medication unless she had a psych evaluation showing diagnosis for the medication treatment. I do not see a med rec release in chart, but it would have been past the 90 day expiration from the date of signature anyway. I asked pt if she would be able to stop by to sign a medical record release to send to Psych office

## 2016-08-06 ENCOUNTER — Other Ambulatory Visit: Payer: Self-pay | Admitting: Internal Medicine

## 2016-08-07 ENCOUNTER — Telehealth: Payer: Self-pay | Admitting: Internal Medicine

## 2016-08-07 ENCOUNTER — Telehealth: Payer: Self-pay

## 2016-08-07 NOTE — Telephone Encounter (Signed)
Under disposition at end of this [place] note pt refused to call 911 but plans to go to ER.

## 2016-08-07 NOTE — Telephone Encounter (Signed)
Rec'd from [place] forward 6 pages to NP [facility]

## 2016-08-07 NOTE — Telephone Encounter (Signed)
PLEASE NOTE: All timestamps contained within this report are represented as [place] Standard Time. CONFIDENTIALTY NOTICE: This fax transmission is intended only for the addressee. It contains information that is legally privileged, confidential or otherwise protected from use or disclosure. If you are not the intended recipient, you are strictly prohibited from reviewing, disclosing, copying using or disseminating any of this information or taking any action in reliance on or regarding this information. If you have received this fax in error, please notify us immediately by telephone so that we can arrange for its return to [place]. Phone: [phone], Toll-Free: [phone], Fax: [phone] Page: 1 of 2 Call Id: [id number] [place] Patient Name: Angela Murphy Gender: Female DOB: [birth year] Age: 70 Y 3 M 23 D Return Phone Number: [phone] (Primary) Address: City/State/Zip: [place] Client [facility] - Client Client Site [place] - Day Physician Webb Silversmith - NP Contact Type Call Who Is Calling Patient / Member / Family / Caregiver Call Type Triage / Clinical Relationship To Patient Self Return Phone Number [phone] (Primary) Chief Complaint CHEST PAIN (>=21 years) - pain, pressure, heaviness or tightness Reason for Call Symptomatic / Request for [place] states she has been having chest tightness for the last two days. Today, she is feeling sweated and has a dry mouth. Appointment Disposition EMR Appointment Not Necessary Info pasted into Epic No PreDisposition InappropriateToAsk Translation No Nurse Assessment Nurse: Manson Allan, RN, Melissa Date/Time (Eastern Time): 08/07/2016 11:43:13 AM Confirm and document reason for call. If symptomatic, describe symptoms. You must click the next button to save  text entered. ---Caller states that she is having chest tightness for the last two days. She is nauseated as well. She is sweating and dry mouth. She does have anxiety. Has the patient traveled out of the country within the last 30 days? ---No Does the patient have any new or worsening symptoms? ---Yes Will a triage be completed? ---Yes Related visit to physician within the last 2 weeks? ---No Does the PT have any chronic conditions? (i.e. diabetes, asthma, etc.) ---Yes List chronic conditions. ---Anxiety, Depression Is the patient pregnant or possibly pregnant? (Ask all females between the ages of 71-55) ---No Is this a behavioral health or substance abuse call? ---No Guidelines Guideline Title Affirmed Question Affirmed Notes Nurse Date/Time (Eastern Time) Chest Pain [1] Chest pain lasts > 5 minutes AND [2] age > 90 Moon, RN, [facility] 08/07/2016 11:44:45 AM PLEASE NOTE: All timestamps contained within this report are represented as [place] Standard Time. CONFIDENTIALTY NOTICE: This fax transmission is intended only for the addressee. It contains information that is legally privileged, confidential or otherwise protected from use or disclosure. If you are not the intended recipient, you are strictly prohibited from reviewing, disclosing, copying using or disseminating any of this information or taking any action in reliance on or regarding this information. If you have received this fax in error, please notify us immediately by telephone so that we can arrange for its return to [place]. Phone: [phone], Toll-Free: [phone], Fax: [phone] Page: 2 of 2 Call Id: [id number] [place]. Time Eilene Ghazi Time) Disposition Final User 08/07/2016 11:36:01 AM Send to Urgent Queue Anne Fu 08/07/2016 11:55:06 AM 911 Outcome Documentation Moon, RN, Melissa Reason: Refused to call 911 but plans to go to the ER. 08/07/2016 11:46:29 AM Call EMS 911 Now Yes Moon, RN, Lenna Sciara Caller Understands:  Yes Disagree/Comply: Disagree  Disagree/Comply Reason: Disagree with instructions Care Advice Given Per Guideline CALL EMS 911 NOW: Immediate medical attention is needed. You need to hang up and call 911 (or an ambulance). Psychologist, forensic Discretion: I'll call you back in a few minutes to be sure you were able to reach them.) CARE ADVICE given per Chest Pain (Adult) guideline.

## 2016-08-07 NOTE — Telephone Encounter (Signed)
noted 

## 2016-08-08 ENCOUNTER — Emergency Department (HOSPITAL_COMMUNITY)
Admission: EM | Admit: 2016-08-08 | Discharge: 2016-08-08 | Disposition: A | Discharge status: Home / Self Care | Payer: Medicare Other | Attending: Emergency Medicine | Admitting: Emergency Medicine

## 2016-08-08 ENCOUNTER — Encounter (HOSPITAL_COMMUNITY): Payer: Self-pay | Admitting: *Deleted

## 2016-08-08 ENCOUNTER — Emergency Department (HOSPITAL_COMMUNITY): Payer: Medicare Other

## 2016-08-08 DIAGNOSIS — I10 Essential (primary) hypertension: Secondary | ICD-10-CM | POA: Insufficient documentation

## 2016-08-08 DIAGNOSIS — Z7982 Long term (current) use of aspirin: Secondary | ICD-10-CM | POA: Insufficient documentation

## 2016-08-08 DIAGNOSIS — F909 Attention-deficit hyperactivity disorder, unspecified type: Secondary | ICD-10-CM | POA: Insufficient documentation

## 2016-08-08 DIAGNOSIS — R109 Unspecified abdominal pain: Secondary | ICD-10-CM | POA: Diagnosis not present

## 2016-08-08 DIAGNOSIS — J181 Lobar pneumonia, unspecified organism: Secondary | ICD-10-CM

## 2016-08-08 DIAGNOSIS — R918 Other nonspecific abnormal finding of lung field: Secondary | ICD-10-CM | POA: Diagnosis not present

## 2016-08-08 DIAGNOSIS — J189 Pneumonia, unspecified organism: Secondary | ICD-10-CM | POA: Insufficient documentation

## 2016-08-08 DIAGNOSIS — R0789 Other chest pain: Secondary | ICD-10-CM | POA: Insufficient documentation

## 2016-08-08 DIAGNOSIS — K8020 Calculus of gallbladder without cholecystitis without obstruction: Secondary | ICD-10-CM | POA: Diagnosis not present

## 2016-08-08 HISTORY — DX: Anxiety disorder, unspecified: F41.9

## 2016-08-08 HISTORY — DX: Other symptoms and signs involving cognitive functions and awareness: R41.89

## 2016-08-08 HISTORY — DX: Attention-deficit hyperactivity disorder, unspecified type: F90.9

## 2016-08-08 LAB — HEPATIC FUNCTION PANEL
ALBUMIN: 4 g/dL (ref 3.5–5.0)
ALK PHOS: 53 U/L (ref 38–126)
ALT: 24 U/L (ref 14–54)
AST: 28 U/L (ref 15–41)
BILIRUBIN TOTAL: 0.4 mg/dL (ref 0.3–1.2)
Bilirubin, Direct: 0.1 mg/dL (ref 0.1–0.5)
Indirect Bilirubin: 0.3 mg/dL (ref 0.3–0.9)
TOTAL PROTEIN: 6.8 g/dL (ref 6.5–8.1)

## 2016-08-08 LAB — BASIC METABOLIC PANEL
Anion gap: 8 (ref 5–15)
BUN: 20 mg/dL (ref 6–20)
CHLORIDE: 107 mmol/L (ref 101–111)
CO2: 24 mmol/L (ref 22–32)
CREATININE: 0.88 mg/dL (ref 0.44–1.00)
Calcium: 9.6 mg/dL (ref 8.9–10.3)
GFR calc Af Amer: >60 mL/min (ref >60)
GFR calc non Af Amer: >60 mL/min (ref >60)
Glucose, Bld: 84 mg/dL (ref 65–99)
POTASSIUM: 3.9 mmol/L (ref 3.5–5.1)
Sodium: 139 mmol/L (ref 135–145)

## 2016-08-08 LAB — CBC
HEMATOCRIT: 38.7 % (ref 36.0–46.0)
Hemoglobin: 12.9 g/dL (ref 12.0–15.0)
MCH: 29.7 pg (ref 26.0–34.0)
MCHC: 33.3 g/dL (ref 30.0–36.0)
MCV: 89.2 fL (ref 78.0–100.0)
PLATELETS: 185 10*3/uL (ref 150–400)
RBC: 4.34 MIL/uL (ref 3.87–5.11)
RDW: 12.9 % (ref 11.5–15.5)
WBC: 5.5 10*3/uL (ref 4.0–10.5)

## 2016-08-08 LAB — URINALYSIS, ROUTINE W REFLEX MICROSCOPIC
Bilirubin Urine: NEGATIVE
GLUCOSE, UA: NEGATIVE mg/dL
HGB URINE DIPSTICK: NEGATIVE
Ketones, ur: NEGATIVE mg/dL
LEUKOCYTES UA: NEGATIVE
Nitrite: NEGATIVE
PH: 6.5 (ref 5.0–8.0)
PROTEIN: NEGATIVE mg/dL
Specific Gravity, Urine: 1.007 (ref 1.005–1.030)

## 2016-08-08 LAB — I-STAT TROPONIN, ED: Troponin i, poc: 0 ng/mL (ref 0.00–0.08)

## 2016-08-08 LAB — LIPASE, BLOOD: LIPASE: 40 U/L (ref 11–51)

## 2016-08-08 MED ORDER — IOPAMIDOL (ISOVUE-300) INJECTION 61%
INTRAVENOUS | Status: AC
  Administered 2016-08-08: 80 mL
  Filled 2016-08-08: qty 100

## 2016-08-08 MED ORDER — AZITHROMYCIN 250 MG PO TABS: 250.0000 mg | ORAL_TABLET | Freq: Every day | ORAL | 0 refills | Status: DC

## 2016-08-08 MED ORDER — ASPIRIN 81 MG PO CHEW: 81.0000 mg | CHEWABLE_TABLET | Freq: Every day | ORAL | 0 refills | Status: DC

## 2016-08-08 NOTE — ED Notes (Signed)
ED Provider at bedside. 

## 2016-08-08 NOTE — ED Notes (Signed)
Pt in CT.

## 2016-08-08 NOTE — ED Notes (Signed)
Gave pt applesauce, per [facility] - RN.

## 2016-08-08 NOTE — Discharge Instructions (Signed)
Your CT scan showed multiple incidental findings that will need to be followed up by your family doctor.  You have atherosclerosis and gallstones.

## 2016-08-08 NOTE — ED Triage Notes (Signed)
Pt reports mid chest tightness for several days. Reports that it feels similar to anxiety but could not get pain to resolve which is abnormal for her. Denies recent cough. No resp distress noted and ekg done at triage.

## 2016-08-08 NOTE — ED Notes (Signed)
Patient transported to X-ray 

## 2016-08-08 NOTE — ED Provider Notes (Signed)
[facility] DEPT Provider Note   CSN: [id number] Arrival date & time: 08/08/16  1047     History   Chief Complaint Chief Complaint  Patient presents with  . Chest Pain    HPI Angela Murphy is a 70 y.o. female.  The history is provided by the patient. No language interpreter was used.  Chest Pain     Angela Murphy is a 70 y.o. female who presents to the Emergency Department complaining of chest pain.  She reports several weeks of intermittent chest tightness. She also reports 1 month of abdominal bloating and intermittent abdominal tightness. She states that she has increased anxiety and stress in her life due to moving to the area 6 months ago and going through a divorce. Meditation and exercise make her symptoms better. She does have the sensation that she cannot take a deep breath. She has a history of hypertension and a family history of heart disease. She has no history of coronary artery disease or blood clots. No fever, cough, shortness of breath, nausea, vomiting, diarrhea.  Past Medical History:  Diagnosis Date  . ADHD   . Anxiety   . Chicken pox   . Cognitive decline   . Depression   . Hypertension   . Hypertriglyceridemia     Patient Active Problem List   Diagnosis Date Noted  . HLD (hyperlipidemia) 02/20/2016  . HTN (hypertension) 02/20/2016  . Anxiety and depression 02/20/2016  . ADD (attention deficit disorder) 02/20/2016  . Insomnia 02/20/2016  . Seasonal allergies 02/20/2016  . Peripheral neuropathy ([place]) 02/20/2016    Past Surgical History:  Procedure Laterality Date  . THYROIDECTOMY, PARTIAL  1974    OB History    No data available       Home Medications    Prior to Admission medications   Medication Sig Start Date End Date Taking? Authorizing Provider  Alpha-Lipoic Acid 600 MG CAPS Take 1 capsule by mouth daily.   Yes Historical Provider, MD  AMBULATORY NON FORMULARY MEDICATION 5,000 mg daily. Medication Name: probiotic pro 15    Yes Historical Provider, MD  AMBULATORY NON FORMULARY MEDICATION 3 g daily. Medication Name: Triphala plus   Yes Historical Provider, MD  amphetamine-dextroamphetamine (ADDERALL) 20 MG tablet Take 10 mg by mouth daily.    Yes Historical Provider, MD  ASHWAGANDHA PO Take 1,600 mg by mouth 2 (two) times daily.   Yes Historical Provider, MD  busPIRone (BUSPAR) 30 MG tablet Take 1 tablet (30 mg total) by mouth 2 (two) times daily. 03/07/16  Yes Jearld Fenton, NP  Cholecalciferol (VITAMIN D3) 5000 units TABS Take 1 tablet by mouth daily.    Yes Historical Provider, MD  Docosahexaenoic Acid (DHA OMEGA 3 PO) Take 1,000 mg by mouth 2 (two) times daily.    Yes Historical Provider, MD  escitalopram (LEXAPRO) 10 MG tablet Take 10 mg by mouth daily.   Yes Historical Provider, MD  ezetimibe (ZETIA) 10 MG tablet Take 1 tablet (10 mg total) by mouth daily. 02/20/16  Yes Jearld Fenton, NP  Glucosamine HCl-MSM 750-750 MG TABS Take 1,500 mg by mouth 2 (two) times daily.    Yes Historical Provider, MD  lisinopril (PRINIVIL,ZESTRIL) 20 MG tablet TAKE ONE TABLET BY MOUTH ONCE DAILY 08/06/16  Yes Jearld Fenton, NP  traZODone (DESYREL) 50 MG tablet Take 50 mg by mouth at bedtime as needed for sleep.    Yes Historical Provider, MD  aspirin 81 MG chewable tablet Chew 1 tablet (81 mg total)  by mouth daily. 08/08/16   Quintella Reichert, MD  azithromycin (ZITHROMAX) 250 MG tablet Take 1 tablet (250 mg total) by mouth daily. Take first 2 tablets together, then 1 every day until finished. 08/08/16   Quintella Reichert, MD    Family History Family History  Problem Relation Age of Onset  . Arthritis Mother   . Heart disease Mother   . Heart disease Father   . Mental illness Maternal Uncle   . Arthritis Maternal Grandmother   . Heart disease Maternal Grandmother   . Alcohol abuse Maternal Grandfather   . Heart disease Maternal Grandfather   . Heart disease Paternal Grandmother   . Heart disease Paternal Grandfather      Social History Social History  Substance Use Topics  . Smoking status: Never Smoker  . Smokeless tobacco: Never Used  . Alcohol use No     Allergies   Review of patient's allergies indicates no known allergies.   Review of Systems Review of Systems  Cardiovascular: Positive for chest pain.  All other systems reviewed and are negative.    Physical Exam Updated Vital Signs BP 131/80   Pulse 67   Temp 98.1 F (36.7 C) (Oral)   Resp 18   SpO2 98%   Physical Exam  Constitutional: She is oriented to person, place, and time. She appears well-developed and well-nourished.  HENT:  Head: Normocephalic and atraumatic.  Cardiovascular: Normal rate and regular rhythm.   No murmur heard. Pulmonary/Chest: Effort normal and breath sounds normal. No respiratory distress.  Abdominal: Soft. There is no rebound and no guarding.  Mild diffuse abdominal tenderness  Musculoskeletal: She exhibits no edema or tenderness.  Neurological: She is alert and oriented to person, place, and time.  Skin: Skin is warm and dry.  Psychiatric: She has a normal mood and affect. Her behavior is normal.  Nursing note and vitals reviewed.    ED Treatments / Results  Labs (all labs ordered are listed, but only abnormal results are displayed) Labs Reviewed  BASIC METABOLIC PANEL  CBC  HEPATIC FUNCTION PANEL  LIPASE, BLOOD  URINALYSIS, ROUTINE W REFLEX MICROSCOPIC (NOT AT [facility])  Randolm Idol, ED    EKG  EKG Interpretation  Date/Time:  Thursday August 08 2016 10:54:19 EDT Ventricular Rate:  65 PR Interval:  162 QRS Duration: 72 QT Interval:  412 QTC Calculation: 428 R Axis:   -36 Text Interpretation:  Normal sinus rhythm Left axis deviation Low voltage QRS Cannot rule out Anteroseptal infarct , age undetermined Abnormal ECG Confirmed by Hazle Coca [phone]) on 08/08/2016 11:18:55 AM Also confirmed by Hazle Coca [phone]), editor Yehuda Mao [phone])  on 08/08/2016 12:07:13 PM        Radiology Dg Chest 2 View  Result Date: 08/08/2016 CLINICAL DATA:  Consistent chest tightness for a while now, bloated abd for almost 3 months now, treated for anxiety issues, - hx of htn, nonsmoker, no known heart or lung issues EXAM: CHEST  2 VIEW COMPARISON:  None. FINDINGS: Cardiac silhouette is normal in size and configuration. Normal mediastinal and hilar contours. There are small nodular opacities at the lung apices, most evident on the right. There is mild apical pleural parenchymal scarring. Lungs are otherwise clear. No pleural effusion or pneumothorax. Skeletal structures are demineralized but intact. IMPRESSION: 1. No acute cardiopulmonary disease. 2. Small nodular opacities in the upper lobes may reflect areas of scarring. Neoplastic disease is possible. Recommend follow-up chest CT without contrast for further assessment. Electronically Signed   By:  Lajean Manes M.D.   On: 08/08/2016 11:34   Ct Chest Wo Contrast  Result Date: 08/08/2016 CLINICAL DATA:  Chest tightness x 3 -4 days. RAD report recommended f/u chest without. abd bloated for several months. Denies abd pain. Denies n/v. Pt has off and on diarrhea. 80 ml iso 300. ^95mL ISOVUE-300 IOPAMIDOL (ISOVUE-300) INJECTION 61% EXAM: CT CHEST, ABDOMEN AND PELVIS WITHOUT CONTRAST TECHNIQUE: Multidetector CT imaging of the chest, abdomen and pelvis was performed following the standard protocol without IV contrast. COMPARISON:  Chest x-ray 08/08/2016 FINDINGS: CT CHEST FINDINGS Cardiovascular: There is extensive atherosclerotic calcification of the coronary arteries. Heart size is normal. Ectatic ascending aorta is 3.5 cm. Thoracic aorta is also calcified. There is no cardiac enlargement or pericardial effusion. Pulmonary arteries have a normal noncontrast appearance. Mediastinum/Nodes: The visualized portion of the thyroid gland has a normal appearance. There is mild diffuse esophageal wall thickening raising the question of esophagitis.  Correlation is recommended with patient's history. Small mediastinal lymph nodes are nonspecific in appearance. Lungs/Pleura: Within the right upper lobe there is a focal area of tree-in-bud opacity, likely indicating a focal area of inflammation/infection. Focal area of atelectasis or inflammation identified within the right middle lobe. No suspicious pulmonary nodules, consolidations, or pleural effusions are identified. The opacity is identified on the chest x-ray are most likely related to EKG leads. Musculoskeletal: Degenerative changes are seen in the spine. CT ABDOMEN PELVIS FINDINGS Hepatobiliary: The liver is homogeneous without focal lesion. Calcified gallstone or gallstones are present. Gallstones estimated to measure 1.8 cm. No pericholecystic fluid. Pancreas: Unremarkable. No pancreatic ductal dilatation or surrounding inflammatory changes. Spleen: Normal in size without focal abnormality. Adrenals/Urinary Tract: Adrenal glands are unremarkable. Kidneys are normal, without renal calculi, focal lesion, or hydronephrosis. Bladder is unremarkable. Stomach/Bowel: The stomach is normal in appearance. Small bowel loops are normal in appearance. Appendix is not seen. Vascular/Lymphatic: There is atherosclerotic calcification of the abdominal aorta. No retroperitoneal or mesenteric adenopathy. Reproductive: The uterus is present no adnexal mass. No free pelvic fluid. Other: Anterior abdominal wall is normal in appearance. Musculoskeletal: Degenerative changes are seen in the spine. Disc height loss and vacuum disc identified at L4-5 and L5-S1. IMPRESSION: 1. No suspicious pulmonary nodules. 2. Focal area of tree in bud in opacity in the right upper lobe indicating probable area of inflammation/infection. Atelectasis or focal infiltrate in the right middle lobe. 3. Diffuse mild thickening of the esophageal wall raising the question of esophagitis. 4. Coronary artery disease. 5. Ectatic ascending aorta. 6.   Aortic atherosclerosis. 7. Cholelithiasis. 8. No acute abnormality of the abdomen and pelvis. 9. Degenerative changes in the spine. Electronically Signed   By: Nolon Nations M.D.   On: 08/08/2016 15:09   Ct Abdomen Pelvis W Contrast  Result Date: 08/08/2016 CLINICAL DATA:  Chest tightness x 3 -4 days. RAD report recommended f/u chest without. abd bloated for several months. Denies abd pain. Denies n/v. Pt has off and on diarrhea. 80 ml iso 300. ^69mL ISOVUE-300 IOPAMIDOL (ISOVUE-300) INJECTION 61% EXAM: CT CHEST, ABDOMEN AND PELVIS WITHOUT CONTRAST TECHNIQUE: Multidetector CT imaging of the chest, abdomen and pelvis was performed following the standard protocol without IV contrast. COMPARISON:  Chest x-ray 08/08/2016 FINDINGS: CT CHEST FINDINGS Cardiovascular: There is extensive atherosclerotic calcification of the coronary arteries. Heart size is normal. Ectatic ascending aorta is 3.5 cm. Thoracic aorta is also calcified. There is no cardiac enlargement or pericardial effusion. Pulmonary arteries have a normal noncontrast appearance. Mediastinum/Nodes: The visualized portion of the  thyroid gland has a normal appearance. There is mild diffuse esophageal wall thickening raising the question of esophagitis. Correlation is recommended with patient's history. Small mediastinal lymph nodes are nonspecific in appearance. Lungs/Pleura: Within the right upper lobe there is a focal area of tree-in-bud opacity, likely indicating a focal area of inflammation/infection. Focal area of atelectasis or inflammation identified within the right middle lobe. No suspicious pulmonary nodules, consolidations, or pleural effusions are identified. The opacity is identified on the chest x-ray are most likely related to EKG leads. Musculoskeletal: Degenerative changes are seen in the spine. CT ABDOMEN PELVIS FINDINGS Hepatobiliary: The liver is homogeneous without focal lesion. Calcified gallstone or gallstones are present. Gallstones  estimated to measure 1.8 cm. No pericholecystic fluid. Pancreas: Unremarkable. No pancreatic ductal dilatation or surrounding inflammatory changes. Spleen: Normal in size without focal abnormality. Adrenals/Urinary Tract: Adrenal glands are unremarkable. Kidneys are normal, without renal calculi, focal lesion, or hydronephrosis. Bladder is unremarkable. Stomach/Bowel: The stomach is normal in appearance. Small bowel loops are normal in appearance. Appendix is not seen. Vascular/Lymphatic: There is atherosclerotic calcification of the abdominal aorta. No retroperitoneal or mesenteric adenopathy. Reproductive: The uterus is present no adnexal mass. No free pelvic fluid. Other: Anterior abdominal wall is normal in appearance. Musculoskeletal: Degenerative changes are seen in the spine. Disc height loss and vacuum disc identified at L4-5 and L5-S1. IMPRESSION: 1. No suspicious pulmonary nodules. 2. Focal area of tree in bud in opacity in the right upper lobe indicating probable area of inflammation/infection. Atelectasis or focal infiltrate in the right middle lobe. 3. Diffuse mild thickening of the esophageal wall raising the question of esophagitis. 4. Coronary artery disease. 5. Ectatic ascending aorta. 6.  Aortic atherosclerosis. 7. Cholelithiasis. 8. No acute abnormality of the abdomen and pelvis. 9. Degenerative changes in the spine. Electronically Signed   By: Nolon Nations M.D.   On: 08/08/2016 15:09    Procedures Procedures (including critical care time)  Medications Ordered in ED Medications  iopamidol (ISOVUE-300) 61 % injection (80 mLs  Contrast Given 08/08/16 1434)     Initial Impression / Assessment and Plan / ED Course  I have reviewed the triage vital signs and the nursing notes.  Pertinent labs & imaging results that were available during my care of the patient were reviewed by me and considered in my medical decision making (see chart for details).  Clinical Course    Pt here with  weeks of intermittent chest tightness, abdominal bloating.  She reports increased stress in her life and sxs improve with activity/meditation.  History and presentation is not consistent with ACS, PE. Chest x-ray with possible pulmonary nodules versus metastatic lesions. CT chest abdomen pelvis obtained given her abdominal bloating and discomfort with pulmonary nodules. CT chest with possible atypical infection and CT abdomen with multiple incidental findings. Presentation is not consistent with acute abdomen, cholecystitis, dissection. Discussed with patient the findings of atherosclerosis, cholelithiasis.  We will treat for possible atypical infection with azithromycin. Discussed close PCP follow-up. Discussed outpatient cardiology follow-up. home care and return precautions discussed.  Final Clinical Impressions(s) / ED Diagnoses   Final diagnoses:  Atypical chest pain  Community acquired pneumonia of right upper lobe of lung ([place])    New Prescriptions Discharge Medication List as of 08/08/2016  3:44 PM    START taking these medications   Details  aspirin 81 MG chewable tablet Chew 1 tablet (81 mg total) by mouth daily., Starting Thu 08/08/2016, Print    azithromycin (ZITHROMAX) 250 MG tablet  Take 1 tablet (250 mg total) by mouth daily. Take first 2 tablets together, then 1 every day until finished., Starting Thu 08/08/2016, Print         Quintella Reichert, MD 08/09/16 [phone]

## 2016-08-09 ENCOUNTER — Ambulatory Visit (INDEPENDENT_AMBULATORY_CARE_PROVIDER_SITE_OTHER): Payer: Medicare Other | Admitting: Psychology

## 2016-08-09 DIAGNOSIS — F4323 Adjustment disorder with mixed anxiety and depressed mood: Secondary | ICD-10-CM

## 2016-08-09 DIAGNOSIS — F331 Major depressive disorder, recurrent, moderate: Secondary | ICD-10-CM | POA: Diagnosis not present

## 2016-08-20 ENCOUNTER — Telehealth: Payer: Self-pay

## 2016-08-20 NOTE — Telephone Encounter (Signed)
Pt left v/m requesting refill of adderall. Call when ready for pick up. Avie Echevaria NP has not filled before. Pt has one pill and pt wants cb. See 08/01/16 phone note. Please advise.

## 2016-08-21 NOTE — Telephone Encounter (Signed)
Pt left v/m;request cb today about adderall rx.

## 2016-08-21 NOTE — Telephone Encounter (Signed)
Patient had a referral for psychology for an evaluation and pt told me that she did not make the appt because a psychologist cannot prescribe medications---I explained to her that they will do the evaluation and based upon diagnosis Rx would be filled if needed per evaluation. Her previous psych notes were illegible and did not seem that an actual evaluation had been completed

## 2016-08-21 NOTE — Telephone Encounter (Signed)
Did she ever schedule her psychology appt?

## 2016-08-21 NOTE — Telephone Encounter (Signed)
August 13, 2016  Me  to [facility]      9:47 AM   I just wanted to let you know we have received records from [facility]. Rollene Fare stated that note did not show where you had an actual psych evaluation to determine diagnosis. You will need to have an evaluation for proper diagnosis. I can request through your previous PCP if you feel this is in error and you have had one.   Regards,  Dineen Conradt,CMA    This MyChart message has not been read.    August 08, 2016  Me  to [place] PM  I just wanted to let you know we have received records from [facility]. Rollene Fare stated that note did not show where you had an actual psych evaluation to determine diagnosis. You will need to have an evaluation for proper diagnosis. I can request through your previous PCP if you feel this is in error and you have had one.   Regards,  Lester Platas,CMA    This MyChart message has not been read.   August 05, 2016  Jearld Fenton, NP  to [name]      1:57 PM  You need to come to the office to sign the release of your medical records. Were you diagnosed with ADD by psychiatry in [place] or by your primary care physician?    This MyChart message has not been read.

## 2016-08-23 ENCOUNTER — Ambulatory Visit (INDEPENDENT_AMBULATORY_CARE_PROVIDER_SITE_OTHER): Payer: Medicare Other | Admitting: Psychology

## 2016-08-23 DIAGNOSIS — F4323 Adjustment disorder with mixed anxiety and depressed mood: Secondary | ICD-10-CM | POA: Diagnosis not present

## 2016-08-23 NOTE — Telephone Encounter (Signed)
Left detailed msg on VM per HIPAA Asking pt if she went ahead and scheduled the psych appt for evaluation as Rollene Fare told her previously that the psych evaluation would be required if pt wanted her to refill Adderrall.Marland KitchenMarland Kitchen

## 2016-08-23 NOTE — Telephone Encounter (Signed)
The records received are illegible, cannot make out any notes and it definitely is not a psych evaluation

## 2016-08-23 NOTE — Telephone Encounter (Signed)
Pt has been told via [place] and on telephone by me that Rollene Fare needs the psych eval completed---pt told me that she did not call to schedule appt with psych because she see's Dr Rexene Edison and that psychologists cannot prescribe medications...the patient was advised that the appt was not for Rx but to have evaluation for diagnosis so that Rollene Fare can fill medication based upon the evaluation--I have emailed pt 2 times and spoke to her over the phone with these instructions over the past 3-4 weeks

## 2016-08-23 NOTE — Telephone Encounter (Signed)
Pt walked in and requested med refill on Adderall.  Explained that we will need a evaluation completed.  I called and scheduled an appointment with Dr. Lurline Hare at [facility] [place] for 09/25/16.  Also printed pt's appointment reminder with PCP on 08/27/16.  BH has her on a cancellation list in case they can see her sooner.  She understands that she will not get medication without diagnosis supporting appropriate condition.

## 2016-08-23 NOTE — Telephone Encounter (Signed)
Patient returned Melanie's call.  Patient has been seeing Dr.Perrin for months and she has an appointment with her this afternoon at 3:00.  Patient is out of medication.  Patient wanted to know if you received records from [place].  Please call patient back at [phone].

## 2016-08-27 ENCOUNTER — Encounter: Payer: Self-pay | Admitting: Internal Medicine

## 2016-08-27 DIAGNOSIS — Z0289 Encounter for other administrative examinations: Secondary | ICD-10-CM

## 2016-08-27 NOTE — Progress Notes (Deleted)
HPI:  Pt presents to the clinic today for her Medicare Wellness Exam.  Past Medical History:  Diagnosis Date  . ADHD   . Anxiety   . Chicken pox   . Cognitive decline   . Depression   . Hypertension   . Hypertriglyceridemia     Current Outpatient Prescriptions  Medication Sig Dispense Refill  . Alpha-Lipoic Acid 600 MG CAPS Take 1 capsule by mouth daily.    . AMBULATORY NON FORMULARY MEDICATION 5,000 mg daily. Medication Name: probiotic pro 15    . AMBULATORY NON FORMULARY MEDICATION 3 g daily. Medication Name: Triphala plus    . amphetamine-dextroamphetamine (ADDERALL) 20 MG tablet Take 10 mg by mouth daily.     . ASHWAGANDHA PO Take 1,600 mg by mouth 2 (two) times daily.    Marland Kitchen aspirin 81 MG chewable tablet Chew 1 tablet (81 mg total) by mouth daily. 30 tablet 0  . azithromycin (ZITHROMAX) 250 MG tablet Take 1 tablet (250 mg total) by mouth daily. Take first 2 tablets together, then 1 every day until finished. 6 tablet 0  . busPIRone (BUSPAR) 30 MG tablet Take 1 tablet (30 mg total) by mouth 2 (two) times daily. 180 tablet 1  . Cholecalciferol (VITAMIN D3) 5000 units TABS Take 1 tablet by mouth daily.     . Docosahexaenoic Acid (DHA OMEGA 3 PO) Take 1,000 mg by mouth 2 (two) times daily.     Marland Kitchen escitalopram (LEXAPRO) 10 MG tablet Take 10 mg by mouth daily.    Marland Kitchen ezetimibe (ZETIA) 10 MG tablet Take 1 tablet (10 mg total) by mouth daily. 90 tablet 1  . Glucosamine HCl-MSM 750-750 MG TABS Take 1,500 mg by mouth 2 (two) times daily.     Marland Kitchen lisinopril (PRINIVIL,ZESTRIL) 20 MG tablet TAKE ONE TABLET BY MOUTH ONCE DAILY 90 tablet 1  . traZODone (DESYREL) 50 MG tablet Take 50 mg by mouth at bedtime as needed for sleep.      No current facility-administered medications for this visit.     No Known Allergies  Family History  Problem Relation Age of Onset  . Arthritis Mother   . Heart disease Mother   . Heart disease Father   . Mental illness Maternal Uncle   . Arthritis Maternal  Grandmother   . Heart disease Maternal Grandmother   . Alcohol abuse Maternal Grandfather   . Heart disease Maternal Grandfather   . Heart disease Paternal Grandmother   . Heart disease Paternal Grandfather     Social History   Social History  . Marital status: Legally Separated    Spouse name: N/A  . Number of children: N/A  . Years of education: N/A   Occupational History  . Not on file.   Social History Main Topics  . Smoking status: Never Smoker  . Smokeless tobacco: Never Used  . Alcohol use No  . Drug use:     Types: Marijuana     Comment: occ  . Sexual activity: Not Currently   Other Topics Concern  . Not on file   Social History Narrative  . No narrative on file    Hospitiliaztions: None  Health Maintenance:    Flu:  Tetanus:  Pneumovax:  Prevnar:  Zostavax:  Mammogram:  Pap Smear:  PSA:  Bone Density:  Colon Screening:  Eye Doctor:  Dental Exam:   Providers:   PCP:  Dermatologist:  Cardiologist:  ENT:  Gastroenterologist:  Pulmonologist:   I have personally reviewed and have noted:  1. The patient's medical and social history 2. Their use of alcohol, tobacco or illicit drugs 3. Their current medications and supplements 4. The patient's functional ability including ADL's, fall risks, home safety risks and  hearing or visual impairment. 5. Diet and physical activities 6. Evidence for depression or mood disorder  Subjective:   Review of Systems:   Constitutional: Denies fever, malaise, fatigue, headache or abrupt weight changes.  HEENT: Denies eye pain, eye redness, ear pain, ringing in the ears, wax buildup, runny nose, nasal congestion, bloody nose, or sore throat. Respiratory: Denies difficulty breathing, shortness of breath, cough or sputum production.   Cardiovascular: Denies chest pain, chest tightness, palpitations or swelling in the hands or feet.  Gastrointestinal: Denies abdominal pain, bloating, constipation, diarrhea or  blood in the stool.  GU: Denies urgency, frequency, pain with urination, burning sensation, blood in urine, odor or discharge. Musculoskeletal: Denies decrease in range of motion, difficulty with gait, muscle pain or joint pain and swelling.  Skin: Denies redness, rashes, lesions or ulcercations.  Neurological: Denies dizziness, difficulty with memory, difficulty with speech or problems with balance and coordination.  Psych: Denies anxiety, depression, SI/HI.  No other specific complaints in a complete review of systems (except as listed in HPI above).  Objective:  PE:   There were no vitals taken for this visit. Wt Readings from Last 3 Encounters:  05/13/16 121 lb 12.8 oz (55.2 kg)  02/20/16 112 lb 12 oz (51.1 kg)    General: Appears their stated age, well developed, well nourished in NAD. Skin: Warm, dry and intact. No rashes, lesions or ulcerations noted. HEENT: Head: normal shape and size; Eyes: sclera white, no icterus, conjunctiva pink, PERRLA and EOMs intact; Ears: Tm's gray and intact, normal light reflex; Throat/Mouth: Teeth present, mucosa pink and moist, no exudate, lesions or ulcerations noted.  Neck: Neck supple, trachea midline. No masses, lumps or thyromegaly present.  Cardiovascular: Normal rate and rhythm. S1,S2 noted.  No murmur, rubs or gallops noted. No JVD or BLE edema. No carotid bruits noted. Pulmonary/Chest: Normal effort and positive vesicular breath sounds. No respiratory distress. No wheezes, rales or ronchi noted.  Abdomen: Soft and nontender. Normal bowel sounds. No distention or masses noted. Liver, spleen and kidneys non palpable. Musculoskeletal: Normal range of motion. Strength 5/5 BUE/BLE. No signs of joint swelling.  Neurological: Alert and oriented. Cranial nerves II-XII grossly intact. Coordination normal.  Psychiatric: Mood and affect normal. Behavior is normal. Judgment and thought content normal.   EKG:  BMET    Component Value Date/Time   NA  139 08/08/2016 1101   K 3.9 08/08/2016 1101   CL 107 08/08/2016 1101   CO2 24 08/08/2016 1101   GLUCOSE 84 08/08/2016 1101   BUN 20 08/08/2016 1101   CREATININE 0.88 08/08/2016 1101   CALCIUM 9.6 08/08/2016 1101   GFRNONAA >60 08/08/2016 1101   GFRAA >60 08/08/2016 1101    Lipid Panel  No results found for: CHOL, TRIG, HDL, CHOLHDL, VLDL, LDLCALC  CBC    Component Value Date/Time   WBC 5.5 08/08/2016 1101   RBC 4.34 08/08/2016 1101   HGB 12.9 08/08/2016 1101   HCT 38.7 08/08/2016 1101   PLT 185 08/08/2016 1101   MCV 89.2 08/08/2016 1101   MCH 29.7 08/08/2016 1101   MCHC 33.3 08/08/2016 1101   RDW 12.9 08/08/2016 1101    Hgb A1C No results found for: HGBA1C    Assessment and Plan:   Medicare Annual Wellness Visit:  Diet:  Heart healthy or DM if diabetic Physical activity: Sedentary Depression/mood screen: Negative Hearing: Intact to whispered voice Visual acuity: Grossly normal, performs annual eye exam  ADLs: Capable Fall risk: None Home safety: Good Cognitive evaluation: Intact to orientation, naming, recall and repetition EOL planning: Adv directives, full code/ I agree  Preventative Medicine:   Next appointment:   Webb Silversmith, NP

## 2016-09-06 DIAGNOSIS — R9402 Abnormal brain scan: Secondary | ICD-10-CM | POA: Insufficient documentation

## 2016-09-12 ENCOUNTER — Encounter: Payer: Self-pay | Admitting: Internal Medicine

## 2016-09-12 ENCOUNTER — Ambulatory Visit (INDEPENDENT_AMBULATORY_CARE_PROVIDER_SITE_OTHER): Payer: Medicare Other | Admitting: Internal Medicine

## 2016-09-12 VITALS — BP 118/82 | HR 76 | Temp 98.1°F | Ht 61.0 in | Wt 125.5 lb

## 2016-09-12 DIAGNOSIS — Z0000 Encounter for general adult medical examination without abnormal findings: Secondary | ICD-10-CM | POA: Diagnosis not present

## 2016-09-12 DIAGNOSIS — Z1159 Encounter for screening for other viral diseases: Secondary | ICD-10-CM

## 2016-09-12 DIAGNOSIS — Z23 Encounter for immunization: Secondary | ICD-10-CM

## 2016-09-12 DIAGNOSIS — Z780 Asymptomatic menopausal state: Secondary | ICD-10-CM

## 2016-09-12 DIAGNOSIS — Z1239 Encounter for other screening for malignant neoplasm of breast: Secondary | ICD-10-CM

## 2016-09-12 DIAGNOSIS — R4189 Other symptoms and signs involving cognitive functions and awareness: Secondary | ICD-10-CM

## 2016-09-12 DIAGNOSIS — Z1231 Encounter for screening mammogram for malignant neoplasm of breast: Secondary | ICD-10-CM

## 2016-09-12 DIAGNOSIS — Z114 Encounter for screening for human immunodeficiency virus [HIV]: Secondary | ICD-10-CM

## 2016-09-12 DIAGNOSIS — Z136 Encounter for screening for cardiovascular disorders: Secondary | ICD-10-CM

## 2016-09-12 NOTE — Progress Notes (Signed)
HPI:  Pt presents to the clinic today for her Medicare Wellness Exam.   Past Medical History:  Diagnosis Date  . ADHD   . Anxiety   . Chicken pox   . Cognitive decline   . Depression   . Hypertension   . Hypertriglyceridemia     Current Outpatient Prescriptions  Medication Sig Dispense Refill  . Alpha-Lipoic Acid 600 MG CAPS Take 1 capsule by mouth daily.    . AMBULATORY NON FORMULARY MEDICATION 5,000 mg daily. Medication Name: probiotic pro 15    . amphetamine-dextroamphetamine (ADDERALL) 20 MG tablet Take 10 mg by mouth daily.     Marland Kitchen aspirin 81 MG chewable tablet Chew 1 tablet (81 mg total) by mouth daily. 30 tablet 0  . busPIRone (BUSPAR) 30 MG tablet Take 1 tablet (30 mg total) by mouth 2 (two) times daily. 180 tablet 1  . Cholecalciferol (VITAMIN D3) 5000 units TABS Take 1 tablet by mouth daily.     . Docosahexaenoic Acid (DHA OMEGA 3 PO) Take 1,000 mg by mouth 2 (two) times daily.     Marland Kitchen escitalopram (LEXAPRO) 10 MG tablet Take 10 mg by mouth daily.    Marland Kitchen ezetimibe (ZETIA) 10 MG tablet Take 1 tablet (10 mg total) by mouth daily. 90 tablet 1  . Glucosamine HCl-MSM 750-750 MG TABS Take 1,500 mg by mouth 2 (two) times daily.     Marland Kitchen lisinopril (PRINIVIL,ZESTRIL) 20 MG tablet TAKE ONE TABLET BY MOUTH ONCE DAILY 90 tablet 1  . Specialty Vitamins Products (BRAIN) TABS Take by mouth. Brain and Body power MAX    . traZODone (DESYREL) 50 MG tablet Take 50 mg by mouth at bedtime as needed for sleep.     . ASHWAGANDHA PO Take 1,600 mg by mouth 2 (two) times daily.     No current facility-administered medications for this visit.     No Known Allergies  Family History  Problem Relation Age of Onset  . Arthritis Mother   . Heart disease Mother   . Heart disease Father   . Mental illness Maternal Uncle   . Arthritis Maternal Grandmother   . Heart disease Maternal Grandmother   . Alcohol abuse Maternal Grandfather   . Heart disease Maternal Grandfather   . Heart disease Paternal  Grandmother   . Heart disease Paternal Grandfather     Social History   Social History  . Marital status: Legally Separated    Spouse name: N/A  . Number of children: N/A  . Years of education: N/A   Occupational History  . Not on file.   Social History Main Topics  . Smoking status: Never Smoker  . Smokeless tobacco: Never Used  . Alcohol use No  . Drug use:     Types: Marijuana     Comment: occ  . Sexual activity: Not Currently   Other Topics Concern  . Not on file   Social History Narrative  . No narrative on file    Hospitiliaztions: None  Health Maintenance:    Flu: 08/2015  Tetanus: unsure  Pneumovax: unsure  Prevnar: unsure  Zostavax: 11/2012  Mammogram: < 5 years ago  Pap Smear: < 5 years ago  Bone Density: unsure  Colon Screening: never  Eye Doctor: as needed  Dental Exam: every 4 months   Providers:   PCP: Webb Silversmith, NP-C     I have personally reviewed and have noted:  1. The patient's medical and social history 2. Their use of alcohol, tobacco  or illicit drugs 3. Their current medications and supplements 4. The patient's functional ability including ADL's, fall risks, home safety risks and hearing or visual impairment. 5. Diet and physical activities 6. Evidence for depression or mood disorder  Subjective:   Review of Systems:   Constitutional: Pt reports weight gain. Denies fever, malaise, fatigue, headache.  HEENT: Denies eye pain, eye redness, ear pain, ringing in the ears, wax buildup, runny nose, nasal congestion, bloody nose, or sore throat. Respiratory: Denies difficulty breathing, shortness of breath, cough or sputum production.   Cardiovascular: Denies chest pain, chest tightness, palpitations or swelling in the hands or feet.  Gastrointestinal: Denies abdominal pain, bloating, constipation, diarrhea or blood in the stool.  GU: Denies urgency, frequency, pain with urination, burning sensation, blood in urine, odor or  discharge. Musculoskeletal: Denies decrease in range of motion, difficulty with gait, muscle pain or joint pain and swelling.  Skin: Denies redness, rashes, lesions or ulcercations.  Neurological: Pt reports numbness and tingling in his feet. Denies dizziness, difficulty with memory, difficulty with speech or problems with balance and coordination.  Psych: Pt reports history of anxiety and depression. Denies SI/HI.  No other specific complaints in a complete review of systems (except as listed in HPI above).  Objective:  PE:  BP 118/82   Pulse 76   Temp 98.1 F (36.7 C) (Oral)   Ht 5\' 1"  (1.549 m)   Wt 125 lb 8 oz (56.9 kg)   SpO2 98%   BMI 23.71 kg/m   Wt Readings from Last 3 Encounters:  09/12/16 125 lb 8 oz (56.9 kg)  05/13/16 121 lb 12.8 oz (55.2 kg)  02/20/16 112 lb 12 oz (51.1 kg)    General: Appears her stated age, well developed, well nourished in NAD. Skin: Warm, dry and intact. Neck: Neck supple, trachea midline. No masses, lumps or thyromegaly present.  Cardiovascular: Normal rate and rhythm. S1,S2 noted.  No murmur, rubs or gallops noted. No JVD or BLE edema. No carotid bruits noted. Pulmonary/Chest: Normal effort and positive vesicular breath sounds. No respiratory distress. No wheezes, rales or ronchi noted.  Neurological: Alert and oriented.  Psychiatric: She seems distracted today.  BMET    Component Value Date/Time   NA 139 08/08/2016 1101   K 3.9 08/08/2016 1101   CL 107 08/08/2016 1101   CO2 24 08/08/2016 1101   GLUCOSE 84 08/08/2016 1101   BUN 20 08/08/2016 1101   CREATININE 0.88 08/08/2016 1101   CALCIUM 9.6 08/08/2016 1101   GFRNONAA >60 08/08/2016 1101   GFRAA >60 08/08/2016 1101    Lipid Panel  No results found for: CHOL, TRIG, HDL, CHOLHDL, VLDL, LDLCALC  CBC    Component Value Date/Time   WBC 5.5 08/08/2016 1101   RBC 4.34 08/08/2016 1101   HGB 12.9 08/08/2016 1101   HCT 38.7 08/08/2016 1101   PLT 185 08/08/2016 1101   MCV 89.2  08/08/2016 1101   MCH 29.7 08/08/2016 1101   MCHC 33.3 08/08/2016 1101   RDW 12.9 08/08/2016 1101    Hgb A1C No results found for: HGBA1C    Assessment and Plan:   Medicare Annual Wellness Visit:  Diet: She does eat some meat. She eats fruits and veggies most days of the week. She tries to avoid fried foods. She drinks mostly water. Physical activity: She does weight training 2 x week, yoga, 3 x week, dance 1 x week and walks 7 x week. Depression/mood screen: Positive, she is taking Lexapro and Buspar.  Hearing: Intact to whispered voice Visual acuity: Grossly normal, performs annual eye exam  ADLs: Capable Fall risk: None Home safety: Good Cognitive evaluation: She is experiencing short term memory loss.  Intact to orientation, naming, recall and repetition EOL planning: Adv directives, full code/ I agree  Preventative Medicine: Flu shot today. Will request immunization records from previous PCP to check on tetanus, penumovax, prevnar. She declines colonoscopy but is agreeable to Cologuard- ordered. Mammogram and bone density ordered. Encouraged her to see an eye doctor and dentist annually. Advised her to consume a balanced diet and exercise regimen. She has a long list of labs her neurologist is requesting for evaluation of memory loss- ordered.   Next appointment: 1 year   Webb Silversmith, NP

## 2016-09-12 NOTE — Patient Instructions (Signed)

## 2016-09-13 ENCOUNTER — Encounter: Payer: Self-pay | Admitting: Internal Medicine

## 2016-09-13 DIAGNOSIS — Z0000 Encounter for general adult medical examination without abnormal findings: Secondary | ICD-10-CM | POA: Diagnosis not present

## 2016-09-13 DIAGNOSIS — Z1159 Encounter for screening for other viral diseases: Secondary | ICD-10-CM | POA: Diagnosis not present

## 2016-09-13 DIAGNOSIS — Z136 Encounter for screening for cardiovascular disorders: Secondary | ICD-10-CM | POA: Diagnosis not present

## 2016-09-13 DIAGNOSIS — Z114 Encounter for screening for human immunodeficiency virus [HIV]: Secondary | ICD-10-CM | POA: Diagnosis not present

## 2016-09-13 DIAGNOSIS — Z780 Asymptomatic menopausal state: Secondary | ICD-10-CM | POA: Diagnosis not present

## 2016-09-13 DIAGNOSIS — R4189 Other symptoms and signs involving cognitive functions and awareness: Secondary | ICD-10-CM | POA: Diagnosis not present

## 2016-09-19 LAB — MYCOPLASMA PNEUMONIAE ANTIBODY, IGG: Mycoplasma pneumo IgG: <100 U/mL (ref 0–99)

## 2016-09-19 LAB — TOTAL GLUTATHIONE: TOTAL GLUTATHIONE: 181 ug/mL (ref 176–323)

## 2016-09-19 LAB — MYCOPLASMA PNEUMONIAE ANTIBODY, IGM: Mycoplasma pneumo IgM: <770 U/mL (ref 0–769)

## 2016-09-20 ENCOUNTER — Ambulatory Visit (INDEPENDENT_AMBULATORY_CARE_PROVIDER_SITE_OTHER): Payer: Medicare Other | Admitting: Psychology

## 2016-09-20 DIAGNOSIS — F4323 Adjustment disorder with mixed anxiety and depressed mood: Secondary | ICD-10-CM | POA: Diagnosis not present

## 2016-09-20 DIAGNOSIS — F331 Major depressive disorder, recurrent, moderate: Secondary | ICD-10-CM

## 2016-09-22 LAB — HNK1 (CD57) PANEL
% CD8-/CD57+ Lymphs: 12.7 % (ref 2.0–17.0)
Abs.CD8-CD57+ Lymphs: 89 /uL (ref 60–360)
BASOS: 1 %
Basophils Absolute: 0 10*3/uL (ref 0.0–0.2)
EOS (ABSOLUTE): 0.1 10*3/uL (ref 0.0–0.4)
EOS: 3 %
HEMATOCRIT: 38 % (ref 34.0–46.6)
HEMOGLOBIN: 12.6 g/dL (ref 11.1–15.9)
Immature Grans (Abs): 0 10*3/uL (ref 0.0–0.1)
Immature Granulocytes: 0 %
LYMPHS ABS: 0.7 10*3/uL (ref 0.7–3.1)
Lymphs: 16 %
MCH: 29.7 pg (ref 26.6–33.0)
MCHC: 33.2 g/dL (ref 31.5–35.7)
MCV: 90 fL (ref 79–97)
MONOCYTES: 9 %
MONOS ABS: 0.4 10*3/uL (ref 0.1–0.9)
NEUTROS ABS: 3 10*3/uL (ref 1.4–7.0)
Neutrophils: 71 %
PLATELETS: 199 10*3/uL (ref 150–379)
RBC: 4.24 x10E6/uL (ref 3.77–5.28)
RDW: 13.8 % (ref 12.3–15.4)
WBC: 4.2 10*3/uL (ref 3.4–10.8)

## 2016-09-22 LAB — LIPID PANEL
CHOL/HDL RATIO: 3.3 ratio (ref 0.0–4.4)
Cholesterol, Total: 232 mg/dL — ABNORMAL HIGH (ref 100–199)
HDL: 71 mg/dL (ref >39)
LDL CALC: 148 mg/dL — AB (ref 0–99)
Triglycerides: 66 mg/dL (ref 0–149)
VLDL CHOLESTEROL CAL: 13 mg/dL (ref 5–40)

## 2016-09-22 LAB — COMPREHENSIVE METABOLIC PANEL
A/G RATIO: 1.7 (ref 1.2–2.2)
ALBUMIN: 4.3 g/dL (ref 3.5–4.8)
ALT: 44 IU/L — AB (ref 0–32)
AST: 46 IU/L — ABNORMAL HIGH (ref 0–40)
Alkaline Phosphatase: 73 IU/L (ref 39–117)
BILIRUBIN TOTAL: 0.4 mg/dL (ref 0.0–1.2)
BUN / CREAT RATIO: 20 (ref 12–28)
BUN: 18 mg/dL (ref 8–27)
CALCIUM: 9.2 mg/dL (ref 8.7–10.3)
CO2: 25 mmol/L (ref 18–29)
Chloride: 103 mmol/L (ref 96–106)
Creatinine, Ser: 0.89 mg/dL (ref 0.57–1.00)
GFR, EST AFRICAN AMERICAN: 76 mL/min/{1.73_m2} (ref >59)
GFR, EST NON AFRICAN AMERICAN: 66 mL/min/{1.73_m2} (ref >59)
GLUCOSE: 88 mg/dL (ref 65–99)
Globulin, Total: 2.5 g/dL (ref 1.5–4.5)
Potassium: 3.9 mmol/L (ref 3.5–5.2)
Sodium: 143 mmol/L (ref 134–144)
TOTAL PROTEIN: 6.8 g/dL (ref 6.0–8.5)

## 2016-09-22 LAB — EHRLICHIA ANTIBODY PANEL
E. CHAFFEENSIS (HME) IGM TITER: NEGATIVE
E.Chaffeensis (HME) IgG: NEGATIVE
HGE IGG TITER: NEGATIVE
HGE IGM TITER: NEGATIVE

## 2016-09-22 LAB — VITAMIN D 25 HYDROXY (VIT D DEFICIENCY, FRACTURES): Vit D, 25-Hydroxy: 43.9 ng/mL (ref 30.0–100.0)

## 2016-09-22 LAB — T4, FREE: Free T4: 1.09 ng/dL (ref 0.82–1.77)

## 2016-09-22 LAB — VITAMIN E: Vitamin E (Alpha Tocopherol): 19.7 mg/L (ref 6.5–21.5)

## 2016-09-22 LAB — VITAMIN B12: VITAMIN B 12: 1193 pg/mL (ref 232–1245)

## 2016-09-22 LAB — T3, FREE: T3, Free: 2.6 pg/mL (ref 2.0–4.4)

## 2016-09-22 LAB — HIV ANTIBODY (ROUTINE TESTING W REFLEX): HIV SCREEN 4TH GENERATION: NONREACTIVE

## 2016-09-22 LAB — BARTONELLA ANITBODY PANEL
B QUINTANA IGG: NEGATIVE {titer}
B QUINTANA IGM: NEGATIVE {titer}

## 2016-09-22 LAB — VITAMIN B6: VITAMIN B6: 105.3 ug/L — AB (ref 2.0–32.8)

## 2016-09-22 LAB — HIGH SENSITIVITY CRP: CRP, High Sensitivity: 3.9 mg/L — ABNORMAL HIGH (ref 0.00–3.00)

## 2016-09-22 LAB — VITAMIN B1: THIAMINE: 183.8 nmol/L (ref 66.5–200.0)

## 2016-09-22 LAB — BARTONELLA ANTIBODY PANEL
B henselae IgG: NEGATIVE titer
B henselae IgM: NEGATIVE titer

## 2016-09-22 LAB — IODINE, SERUM/PLASMA: Iodine: 54.3 ug/L (ref 40.0–92.0)

## 2016-09-22 LAB — BABESIA MICROTI ANTIBODY PANEL: Babesia microti IgM: <1:10 {titer}

## 2016-09-22 LAB — FOLATE

## 2016-09-22 LAB — METHYLMALONIC ACID, SERUM: METHYLMALONIC ACID: 170 nmol/L (ref 0–378)

## 2016-09-22 LAB — MAGNESIUM, RBC: Magnesium RBC: 4.8 mg/dL (ref 4.2–6.8)

## 2016-09-22 LAB — HCV COMMENT:

## 2016-09-22 LAB — DHEA-SULFATE, SERUM: DHEA: 18 ug/dL

## 2016-09-22 LAB — COPPER, SERUM: Copper: 107 ug/dL (ref 72–166)

## 2016-09-22 LAB — INSULIN, RANDOM: INSULIN: 7 u[IU]/mL (ref 2.6–24.9)

## 2016-09-22 LAB — T3: T3, Total: 97 ng/dL (ref 71–180)

## 2016-09-22 LAB — TSH: TSH: 2.13 u[IU]/mL (ref 0.450–4.500)

## 2016-09-22 LAB — ZINC: ZINC: 71 ug/dL (ref 56–134)

## 2016-09-22 LAB — B. BURGDORFI ANTIBODIES: Lyme IgG/IgM Ab: <0.91 {ISR} (ref 0.00–0.90)

## 2016-09-22 LAB — HEPATITIS C ANTIBODY (REFLEX)

## 2016-09-22 LAB — FERRITIN: Ferritin: 158 ng/mL — ABNORMAL HIGH (ref 15–150)

## 2016-09-22 LAB — HOMOCYSTEINE: HOMOCYSTEINE: 7.5 umol/L (ref 0.0–15.0)

## 2016-09-22 LAB — SELENIUM SERUM: SELENIUM, S/P: 145 ug/L (ref 79–326)

## 2016-09-25 ENCOUNTER — Encounter: Payer: Self-pay | Admitting: Internal Medicine

## 2016-09-25 ENCOUNTER — Ambulatory Visit: Payer: Medicare Other | Admitting: Psychology

## 2016-10-11 ENCOUNTER — Ambulatory Visit (INDEPENDENT_AMBULATORY_CARE_PROVIDER_SITE_OTHER): Payer: Medicare Other | Admitting: Psychology

## 2016-10-11 ENCOUNTER — Ambulatory Visit (INDEPENDENT_AMBULATORY_CARE_PROVIDER_SITE_OTHER): Payer: Medicare Other | Admitting: Pulmonary Disease

## 2016-10-11 ENCOUNTER — Encounter: Payer: Self-pay | Admitting: Pulmonary Disease

## 2016-10-11 VITALS — BP 122/72 | HR 74 | Wt 133.0 lb

## 2016-10-11 DIAGNOSIS — F4323 Adjustment disorder with mixed anxiety and depressed mood: Secondary | ICD-10-CM

## 2016-10-11 DIAGNOSIS — G4733 Obstructive sleep apnea (adult) (pediatric): Secondary | ICD-10-CM

## 2016-10-11 NOTE — Patient Instructions (Signed)
Mask fit clinic Continue BiPAP Follow up in 3 months with Dr Ashby Dawes

## 2016-10-15 ENCOUNTER — Encounter: Payer: Self-pay | Admitting: Internal Medicine

## 2016-10-18 ENCOUNTER — Ambulatory Visit (INDEPENDENT_AMBULATORY_CARE_PROVIDER_SITE_OTHER): Payer: Medicare Other | Admitting: Psychology

## 2016-10-18 ENCOUNTER — Other Ambulatory Visit: Payer: Self-pay

## 2016-10-18 DIAGNOSIS — F4310 Post-traumatic stress disorder, unspecified: Secondary | ICD-10-CM

## 2016-10-18 DIAGNOSIS — F4325 Adjustment disorder with mixed disturbance of emotions and conduct: Secondary | ICD-10-CM

## 2016-10-18 MED ORDER — ESCITALOPRAM OXALATE 10 MG PO TABS: 10.0000 mg | ORAL_TABLET | Freq: Every day | ORAL | 1 refills | Status: DC

## 2016-10-18 NOTE — Telephone Encounter (Signed)
I do not see where you have ever filled this medication.... please advise 

## 2016-10-19 NOTE — Progress Notes (Signed)
PULMONARY OFFICE FOLLOW UP NOTE  PROBLEMS:  OSA  DATA: 06/25/16 CPAP titration: BiPAP 10/5 recommended 12/06-01/04 compliance study: Usage days 30/30. > 4 hrs 28/30.   INTERVAL HISTORY: Home BiPAP initiated  SUBJ: Pt of DR but needs to be seen to be get certified for continued BiPAP. Tolerating it well without complaints  OBJ: Vitals:   10/11/16 1122  BP: 122/72  Pulse: 74  SpO2: 97%  Weight: 133 lb (60.3 kg)   NAD HEENT WNL Chest clear RRR, no M NABS, soft No C/C/E   DATA: As above  IMPRESSION: OSA on BiPAP with good compliance and good tolerance. Still has a few events per hour per compliance report. Having some difficulty with mask fit  PLAN: 1) Referred to mask fit clinic @ [facility] 2) Continue BiPAP @ current settings for now 3) Follow up in 3 months with Dr Pincus Sanes, MD PCCM service Mobile [phone] Pager [phone] 10/19/2016

## 2016-10-22 ENCOUNTER — Ambulatory Visit (HOSPITAL_BASED_OUTPATIENT_CLINIC_OR_DEPARTMENT_OTHER): Payer: Medicare Other | Attending: Pulmonary Disease | Admitting: Radiology

## 2016-10-22 DIAGNOSIS — G4733 Obstructive sleep apnea (adult) (pediatric): Secondary | ICD-10-CM

## 2016-10-25 MED ORDER — ARMODAFINIL 150 MG PO TABS: 150.0000 mg | ORAL_TABLET | Freq: Every day | ORAL | 0 refills | Status: DC

## 2016-10-30 ENCOUNTER — Ambulatory Visit (INDEPENDENT_AMBULATORY_CARE_PROVIDER_SITE_OTHER): Payer: Medicare Other | Admitting: Family Medicine

## 2016-10-30 ENCOUNTER — Encounter: Payer: Self-pay | Admitting: Family Medicine

## 2016-10-30 VITALS — BP 124/70 | HR 70 | Temp 98.3°F | Wt 128.0 lb

## 2016-10-30 DIAGNOSIS — J069 Acute upper respiratory infection, unspecified: Secondary | ICD-10-CM

## 2016-10-30 MED ORDER — HYDROCODONE-HOMATROPINE 5-1.5 MG/5ML PO SYRP: 5.0000 mL | ORAL_SOLUTION | Freq: Three times a day (TID) | ORAL | 0 refills | Status: DC | PRN

## 2016-10-30 NOTE — Patient Instructions (Signed)
Great to meet you. Drink plenty of fluids.  Continue alleve as needed with food.  Hycodan as needed for cough at bedtime.

## 2016-10-30 NOTE — Progress Notes (Signed)
Pre visit review using our clinic review tool, if applicable. No additional management support is needed unless otherwise documented below in the visit note. 

## 2016-10-30 NOTE — Progress Notes (Signed)
SUBJECTIVE:  Angela Murphy is a 71 y.o. female pt of Webb Silversmith, new to me, who complains of congestion, sore throat, productive cough and bilateral ear pain for 3 days. She denies a history of anorexia and chest pain and denies a history of asthma. Patient denies smoke cigarettes.   Current Outpatient Prescriptions on File Prior to Visit  Medication Sig Dispense Refill  . AMBULATORY NON FORMULARY MEDICATION 5,000 mg daily. Medication Name: probiotic pro 15    . Armodafinil 150 MG tablet Take 1 tablet (150 mg total) by mouth daily. 30 tablet 0  . ASHWAGANDHA PO Take 1,600 mg by mouth 2 (two) times daily.    Marland Kitchen aspirin 81 MG chewable tablet Chew 1 tablet (81 mg total) by mouth daily. 30 tablet 0  . busPIRone (BUSPAR) 30 MG tablet Take 1 tablet (30 mg total) by mouth 2 (two) times daily. 180 tablet 1  . Cholecalciferol (VITAMIN D3) 5000 units TABS Take 1 tablet by mouth daily.     . Docosahexaenoic Acid (DHA OMEGA 3 PO) Take 1,000 mg by mouth 2 (two) times daily.     Marland Kitchen escitalopram (LEXAPRO) 10 MG tablet Take 1 tablet (10 mg total) by mouth daily. 90 tablet 1  . ezetimibe (ZETIA) 10 MG tablet Take 1 tablet (10 mg total) by mouth daily. 90 tablet 1  . Glucosamine HCl-MSM 750-750 MG TABS Take 1,500 mg by mouth 2 (two) times daily.     Marland Kitchen lisinopril (PRINIVIL,ZESTRIL) 20 MG tablet TAKE ONE TABLET BY MOUTH ONCE DAILY 90 tablet 1  . Misc Natural Products (ENERGY FOCUS) TABS Take 4 tablets by mouth daily.    Marland Kitchen OVER THE COUNTER MEDICATION Take 4 capsules by mouth daily. Serotonin Booster    . Specialty Vitamins Products (BRAIN) TABS Take 1 each by mouth daily. Brain and Body power MAX     . traZODone (DESYREL) 50 MG tablet Take 50 mg by mouth at bedtime as needed for sleep.      No current facility-administered medications on file prior to visit.     No Known Allergies  Past Medical History:  Diagnosis Date  . ADHD   . Anxiety   . Chicken pox   . Cognitive decline   . Depression   .  Hypertension   . Hypertriglyceridemia     Past Surgical History:  Procedure Laterality Date  . THYROIDECTOMY, PARTIAL  1974    Family History  Problem Relation Age of Onset  . Arthritis Mother   . Heart disease Mother   . Heart disease Father   . Mental illness Maternal Uncle   . Arthritis Maternal Grandmother   . Heart disease Maternal Grandmother   . Alcohol abuse Maternal Grandfather   . Heart disease Maternal Grandfather   . Heart disease Paternal Grandmother   . Heart disease Paternal Grandfather     Social History   Social History  . Marital status: Legally Separated    Spouse name: N/A  . Number of children: N/A  . Years of education: N/A   Occupational History  . Not on file.   Social History Main Topics  . Smoking status: Never Smoker  . Smokeless tobacco: Never Used  . Alcohol use No  . Drug use: Yes    Types: Marijuana     Comment: occ  . Sexual activity: Not Currently   Other Topics Concern  . Not on file   Social History Narrative  . No narrative on file   The PMH,  PSH, Social History, Family History, Medications, and allergies have been reviewed in [facility], and have been updated if relevant.  OBJECTIVE: BP 124/70   Pulse 70   Temp 98.3 F (36.8 C) (Oral)   Wt 128 lb (58.1 kg)   SpO2 96%   BMI 24.19 kg/m    She appears well, vital signs are as noted. Ears normal.  Throat and pharynx normal.  Neck supple. No adenopathy in the neck. Nose is congested. Sinuses non tender. The chest is clear, without wheezes or rales.  ASSESSMENT:  viral upper respiratory illness  PLAN: Symptomatic therapy suggested: push fluids, rest and return office visit prn if symptoms persist or worsen. Lack of antibiotic effectiveness discussed with her. Call or return to clinic prn if these symptoms worsen or fail to improve as anticipated.

## 2016-11-01 ENCOUNTER — Ambulatory Visit (INDEPENDENT_AMBULATORY_CARE_PROVIDER_SITE_OTHER): Payer: Medicare Other | Admitting: Psychology

## 2016-11-01 ENCOUNTER — Encounter: Payer: Self-pay | Admitting: Internal Medicine

## 2016-11-01 DIAGNOSIS — F4325 Adjustment disorder with mixed disturbance of emotions and conduct: Secondary | ICD-10-CM | POA: Diagnosis not present

## 2016-11-01 DIAGNOSIS — F4310 Post-traumatic stress disorder, unspecified: Secondary | ICD-10-CM | POA: Diagnosis not present

## 2016-11-01 NOTE — Telephone Encounter (Signed)
Pt left /vm requesting cb [phone]  about low thyroid.[facility] [address]

## 2016-11-04 NOTE — Telephone Encounter (Signed)
Pt left v/m requesting cb [phone]. Pt received the email note from R Baity NP but pt is concerned about thyroid med because the psychiatrist in [place] said T3 are not normal and needs supplementation;pt request cb.

## 2016-11-09 ENCOUNTER — Other Ambulatory Visit: Payer: Self-pay | Admitting: Internal Medicine

## 2016-11-11 NOTE — Telephone Encounter (Signed)
Verbal ok from [facility] to refill

## 2016-11-11 NOTE — Telephone Encounter (Signed)
msg was also sent via mychart to pt from [place] and it states pt did read on 11/05/2016

## 2016-11-12 ENCOUNTER — Ambulatory Visit
Admission: RE | Admit: 2016-11-12 | Discharge: 2016-11-12 | Disposition: A | Discharge status: Home / Self Care | Payer: Medicare Other | Source: Ambulatory Visit | Attending: Internal Medicine | Admitting: Internal Medicine

## 2016-11-12 DIAGNOSIS — Z1231 Encounter for screening mammogram for malignant neoplasm of breast: Secondary | ICD-10-CM | POA: Diagnosis not present

## 2016-11-12 DIAGNOSIS — M8588 Other specified disorders of bone density and structure, other site: Secondary | ICD-10-CM | POA: Insufficient documentation

## 2016-11-12 DIAGNOSIS — M419 Scoliosis, unspecified: Secondary | ICD-10-CM | POA: Diagnosis not present

## 2016-11-12 DIAGNOSIS — R921 Mammographic calcification found on diagnostic imaging of breast: Secondary | ICD-10-CM | POA: Diagnosis not present

## 2016-11-12 DIAGNOSIS — Z1239 Encounter for other screening for malignant neoplasm of breast: Secondary | ICD-10-CM

## 2016-11-12 DIAGNOSIS — Z780 Asymptomatic menopausal state: Secondary | ICD-10-CM | POA: Insufficient documentation

## 2016-11-12 DIAGNOSIS — M85851 Other specified disorders of bone density and structure, right thigh: Secondary | ICD-10-CM | POA: Diagnosis not present

## 2016-11-15 ENCOUNTER — Ambulatory Visit (INDEPENDENT_AMBULATORY_CARE_PROVIDER_SITE_OTHER): Payer: Medicare Other | Admitting: Psychology

## 2016-11-15 DIAGNOSIS — F4310 Post-traumatic stress disorder, unspecified: Secondary | ICD-10-CM | POA: Diagnosis not present

## 2016-11-15 DIAGNOSIS — F4323 Adjustment disorder with mixed anxiety and depressed mood: Secondary | ICD-10-CM

## 2016-11-18 ENCOUNTER — Encounter: Payer: Self-pay | Admitting: Internal Medicine

## 2016-11-19 ENCOUNTER — Telehealth: Payer: Self-pay | Admitting: Internal Medicine

## 2016-11-19 NOTE — Telephone Encounter (Signed)
Pt is requesting a cb to discuss her care with Webb Silversmith. Pt states she is unhappy with her care and she has multiple issues that are not being addressed. She is requesting a cb asap.

## 2016-11-20 ENCOUNTER — Inpatient Hospital Stay
Admission: RE | Admit: 2016-11-20 | Discharge: 2016-11-20 | Disposition: A | Discharge status: Home / Self Care | Payer: Self-pay | Source: Ambulatory Visit | Attending: *Deleted | Admitting: *Deleted

## 2016-11-20 ENCOUNTER — Other Ambulatory Visit: Payer: Self-pay | Admitting: *Deleted

## 2016-11-20 DIAGNOSIS — Z9289 Personal history of other medical treatment: Secondary | ICD-10-CM

## 2016-11-21 ENCOUNTER — Other Ambulatory Visit: Payer: Self-pay | Admitting: Internal Medicine

## 2016-11-21 DIAGNOSIS — R921 Mammographic calcification found on diagnostic imaging of breast: Secondary | ICD-10-CM

## 2016-11-21 DIAGNOSIS — R928 Other abnormal and inconclusive findings on diagnostic imaging of breast: Secondary | ICD-10-CM

## 2016-11-21 NOTE — Telephone Encounter (Signed)
Spoke with patient at length regarding her concerns in finding a caring MD who can work with her specialists to address her concerns.  She will be contacting [facility] to see if they have an MD so she may transfer.  Pt will call to give [place] name of new provider.

## 2016-11-22 NOTE — Telephone Encounter (Signed)
noted 

## 2016-11-29 ENCOUNTER — Ambulatory Visit (INDEPENDENT_AMBULATORY_CARE_PROVIDER_SITE_OTHER): Payer: Medicare Other | Admitting: Psychology

## 2016-11-29 DIAGNOSIS — F4323 Adjustment disorder with mixed anxiety and depressed mood: Secondary | ICD-10-CM

## 2016-11-29 DIAGNOSIS — F331 Major depressive disorder, recurrent, moderate: Secondary | ICD-10-CM | POA: Diagnosis not present

## 2016-12-05 DIAGNOSIS — C801 Malignant (primary) neoplasm, unspecified: Secondary | ICD-10-CM

## 2016-12-05 HISTORY — DX: Malignant (primary) neoplasm, unspecified: C80.1

## 2016-12-11 ENCOUNTER — Ambulatory Visit
Admission: RE | Admit: 2016-12-11 | Discharge: 2016-12-11 | Disposition: A | Discharge status: Home / Self Care | Payer: Medicare Other | Source: Ambulatory Visit | Attending: Internal Medicine | Admitting: Internal Medicine

## 2016-12-11 ENCOUNTER — Other Ambulatory Visit: Payer: Self-pay | Admitting: Internal Medicine

## 2016-12-11 DIAGNOSIS — R921 Mammographic calcification found on diagnostic imaging of breast: Secondary | ICD-10-CM

## 2016-12-11 DIAGNOSIS — R928 Other abnormal and inconclusive findings on diagnostic imaging of breast: Secondary | ICD-10-CM | POA: Diagnosis present

## 2016-12-13 ENCOUNTER — Ambulatory Visit: Payer: Medicare Other | Admitting: Psychology

## 2016-12-25 ENCOUNTER — Ambulatory Visit
Admission: RE | Admit: 2016-12-25 | Discharge: 2016-12-25 | Disposition: A | Discharge status: Home / Self Care | Payer: Medicare Other | Source: Ambulatory Visit | Attending: Internal Medicine | Admitting: Internal Medicine

## 2016-12-25 DIAGNOSIS — R928 Other abnormal and inconclusive findings on diagnostic imaging of breast: Secondary | ICD-10-CM

## 2016-12-25 DIAGNOSIS — R921 Mammographic calcification found on diagnostic imaging of breast: Secondary | ICD-10-CM

## 2016-12-25 DIAGNOSIS — D0512 Intraductal carcinoma in situ of left breast: Secondary | ICD-10-CM | POA: Diagnosis not present

## 2016-12-25 HISTORY — PX: BREAST BIOPSY: SHX20

## 2016-12-26 ENCOUNTER — Telehealth: Payer: Self-pay | Admitting: Internal Medicine

## 2016-12-26 LAB — SURGICAL PATHOLOGY

## 2016-12-26 NOTE — Telephone Encounter (Signed)
Angela Murphy She made pt appointment with Dr Rochel Brome @ [facility] March 28 @ 7:45 Dr Janese Banks  @ [facility] 01/07/17 @ 3 She need referral for both of these appointment

## 2016-12-26 NOTE — Progress Notes (Signed)
  Oncology Nurse Navigator Documentation  Navigator Location: [facility] (12/26/16 1700)   )Navigator Encounter Type: Introductory phone call;Telephone (12/26/16 1700)   Abnormal Finding Date: 12/11/16 (12/26/16 1700) Confirmed Diagnosis Date: 12/25/16 (12/26/16 1700)               Patient Visit Type: Initial (12/26/16 1700) Treatment Phase: Pre-Tx/Tx Discussion (12/26/16 1700) Barriers/Navigation Needs: Education;Coordination of Care (12/26/16 1700) Education: Accessing Care/ Finding Providers;Coping with Diagnosis/ Prognosis;Newly Diagnosed Cancer Education (12/26/16 1700) Interventions: Psycho-social support;Coordination of Care;Education (12/26/16 1700)   Coordination of Care: Appts (12/26/16 1700) Education Method: Verbal (12/26/16 1700)      Acuity: Level 3 (12/26/16 1700)     Acuity Level 3: Coordination of multimodality treatment;Emotional needs;Ongoing guidance and education provided throughout treatment (12/26/16 1700)   Time Spent with Patient: 60 (12/26/16 1700)  Introduced IT trainer.  Scheduled surgical/medonc consults.  She is scheduled to see Dr. Rochel Brome 01/01/17 at 7:45, and Dr. Janese Banks 01/07/17 at 3:00.  Patient has no family living here. Discussed support groups available.

## 2016-12-27 ENCOUNTER — Ambulatory Visit (INDEPENDENT_AMBULATORY_CARE_PROVIDER_SITE_OTHER): Payer: Medicare Other | Admitting: Psychology

## 2016-12-27 DIAGNOSIS — F4323 Adjustment disorder with mixed anxiety and depressed mood: Secondary | ICD-10-CM | POA: Diagnosis not present

## 2016-12-27 DIAGNOSIS — F4310 Post-traumatic stress disorder, unspecified: Secondary | ICD-10-CM

## 2016-12-27 NOTE — Telephone Encounter (Signed)
I though this patients transferred care to a MD. Can you please find out?

## 2016-12-27 NOTE — Telephone Encounter (Signed)
Pt is est care at [facility]---pt requested Rollene Fare not be her provider any longer on 11/19/2016

## 2016-12-30 NOTE — Telephone Encounter (Signed)
Left message with ann to let her know pt is not a pt of regina baitys

## 2017-01-01 ENCOUNTER — Encounter: Payer: Medicare Other | Admitting: Family Medicine

## 2017-01-01 ENCOUNTER — Other Ambulatory Visit: Payer: Self-pay | Admitting: Surgery

## 2017-01-01 ENCOUNTER — Encounter: Payer: Self-pay | Admitting: Family Medicine

## 2017-01-01 DIAGNOSIS — D0512 Intraductal carcinoma in situ of left breast: Secondary | ICD-10-CM | POA: Diagnosis not present

## 2017-01-01 NOTE — Patient Instructions (Signed)
     IF you received an x-ray today, you will receive an invoice from [facility]. Please contact [facility] at [phone] with questions or concerns regarding your invoice.   IF you received labwork today, you will receive an invoice from [facility]. Please contact [facility] at [phone] with questions or concerns regarding your invoice.   Our billing staff will not be able to assist you with questions regarding bills from these companies.  You will be contacted with the lab results as soon as they are available. The fastest way to get your results is to activate your My Chart account. Instructions are located on the last page of this paperwork. If you have not heard from us regarding the results in 2 weeks, please contact this office.     

## 2017-01-01 NOTE — Progress Notes (Deleted)
Subjective:    Patient ID: Angela Murphy, female    DOB: 1945/10/22, 71 y.o.   MRN: [id number]  01/01/2017  Establish Care   HPI  Review of Systems  Constitutional: Negative for chills, diaphoresis, fatigue and fever.  Eyes: Negative for visual disturbance.  Respiratory: Negative for cough and shortness of breath.   Cardiovascular: Negative for chest pain, palpitations and leg swelling.  Gastrointestinal: Negative for abdominal pain, constipation, diarrhea, nausea and vomiting.  Endocrine: Negative for cold intolerance, heat intolerance, polydipsia, polyphagia and polyuria.  Neurological: Negative for dizziness, tremors, seizures, syncope, facial asymmetry, speech difficulty, weakness, light-headedness, numbness and headaches.    Past Medical History:  Diagnosis Date  . ADHD   . Allergy   . Anemia   . Anxiety   . Cancer ([place])   . Chicken pox   . Cognitive decline   . Depression   . Hypertension   . Hypertriglyceridemia   . Neuromuscular disorder [facility])    Past Surgical History:  Procedure Laterality Date  . BREAST BIOPSY Left 12/25/2016   path pending  . EYE SURGERY    . THYROIDECTOMY, PARTIAL  1974   No Known Allergies Current Outpatient Prescriptions  Medication Sig Dispense Refill  . AMBULATORY NON FORMULARY MEDICATION 5,000 mg daily. Medication Name: probiotic pro 15    . aspirin 81 MG chewable tablet Chew 1 tablet (81 mg total) by mouth daily. 30 tablet 0  . busPIRone (BUSPAR) 30 MG tablet TAKE ONE TABLET BY MOUTH TWICE DAILY 180 tablet 2  . Cholecalciferol (VITAMIN D3) 5000 units TABS Take 1 tablet by mouth daily.     . Docosahexaenoic Acid (DHA OMEGA 3 PO) Take 1,000 mg by mouth 2 (two) times daily.     Marland Kitchen escitalopram (LEXAPRO) 10 MG tablet Take 1 tablet (10 mg total) by mouth daily. 90 tablet 1  . ezetimibe (ZETIA) 10 MG tablet Take 1 tablet (10 mg total) by mouth daily. 90 tablet 1  . Glucosamine HCl-MSM 750-750 MG TABS Take 1,500 mg by mouth 2 (two)  times daily.     Marland Kitchen lisinopril (PRINIVIL,ZESTRIL) 20 MG tablet TAKE ONE TABLET BY MOUTH ONCE DAILY 90 tablet 1  . Misc Natural Products (ENERGY FOCUS) TABS Take 4 tablets by mouth daily.    Marland Kitchen OVER THE COUNTER MEDICATION Take 4 capsules by mouth daily. Serotonin Booster    . Specialty Vitamins Products (BRAIN) TABS Take 1 each by mouth daily. Brain and Body power MAX     . traZODone (DESYREL) 50 MG tablet Take 50 mg by mouth at bedtime as needed for sleep.     . Armodafinil 150 MG tablet Take 1 tablet (150 mg total) by mouth daily. (Patient not taking: Reported on 01/01/2017) 30 tablet 0   No current facility-administered medications for this visit.    Social History   Social History  . Marital status: Legally Separated    Spouse name: N/A  . Number of children: N/A  . Years of education: N/A   Occupational History  . Not on file.   Social History Main Topics  . Smoking status: Never Smoker  . Smokeless tobacco: Never Used  . Alcohol use 0.0 oz/week  . Drug use: Yes    Types: Marijuana     Comment: occ  . Sexual activity: Not Currently   Other Topics Concern  . Not on file   Social History Narrative  . No narrative on file   Family History  Problem Relation Age of Onset  .  Arthritis Mother   . Heart disease Mother   . Heart disease Father   . Mental illness Maternal Uncle   . Arthritis Maternal Grandmother   . Heart disease Maternal Grandmother   . Alcohol abuse Maternal Grandfather   . Heart disease Maternal Grandfather   . Heart disease Paternal Grandmother   . Heart disease Paternal Grandfather        Objective:    BP 116/71   Pulse 71   Temp 98.1 F (36.7 C) (Oral)   Resp 16   Ht 5\' 1"  (1.549 m)   Wt 122 lb (55.3 kg)   SpO2 96%   BMI 23.05 kg/m  Physical Exam  Constitutional: She is oriented to person, place, and time. She appears well-developed and well-nourished. No distress.  HENT:  Head: Normocephalic and atraumatic.  Right Ear: External ear  normal.  Left Ear: External ear normal.  Nose: Nose normal.  Mouth/Throat: Oropharynx is clear and moist.  Eyes: Conjunctivae and EOM are normal. Pupils are equal, round, and reactive to light.  Neck: Normal range of motion. Neck supple. Carotid bruit is not present. No thyromegaly present.  Cardiovascular: Normal rate, regular rhythm, normal heart sounds and intact distal pulses.  Exam reveals no gallop and no friction rub.   No murmur heard. Pulmonary/Chest: Effort normal and breath sounds normal. She has no wheezes. She has no rales.  Abdominal: Soft. Bowel sounds are normal. She exhibits no distension and no mass. There is no tenderness. There is no rebound and no guarding.  Lymphadenopathy:    She has no cervical adenopathy.  Neurological: She is alert and oriented to person, place, and time. No cranial nerve deficit.  Skin: Skin is warm and dry. No rash noted. She is not diaphoretic. No erythema. No pallor.  Psychiatric: She has a normal mood and affect. Her behavior is normal.   Results for orders placed or performed during the hospital encounter of 12/25/16  Surgical pathology  Result Value Ref Range   SURGICAL PATHOLOGY      Surgical Pathology CASE: [id number] PATIENT: Angela Murphy Surgical Pathology Report     SPECIMEN SUBMITTED: A. Breast, left, UOQ  CLINICAL HISTORY: Screening detected calcifications  PRE-OPERATIVE DIAGNOSIS: Concerning for DCIS.  Possible FA  POST-OPERATIVE DIAGNOSIS: None provided.     DIAGNOSIS: A. 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  Note: Biomarker testing is deferred to an excision specimen.  DCIS is present in 6 of 7 blocks and spans 3 mm in linear dimension on a single slide.  Results were called to [place], [place] on 12/26/16 at 215 PM.   GROSS DESCRIPTION:  A. The specimen  is received in a formalin-filled [place] collection device labeled with the patient's name and left breast UOQ.  Core pieces: multiple Measurement: 7.0 x 1.8 x 0.2 cm Comments: yellow lobulated fibrofatty, marked black  Accompanying specimen radiograph: yes, calcifications and sections A, B, D, E, F and G  Entirely submitted in cassette(s):  1-section A 2-section B 3-section D 4-section E 5-section F 6-section G 7-remaining tissue  Time/Date in fixative: collected at 1321 and placed in formalin at 1324 on 12/25/2016 Total fixation time: 7 hours Final Diagnosis performed by Delorse Lek, MD.  Electronically signed 12/26/2016 2:22:18PM    The electronic signature indicates that the named Attending Pathologist has evaluated the specimen  Technical component performed at [facility], [address] [place], [place]  [place] Lab: [phone] Dir: Darrick Penna. Evette Doffing, MD  Professional component performed at [facility], [facility], [place], [place], [place] [address] Lab: [phone] Dir: Dellia Nims. Rubinas, MD         Assessment & Plan:  No diagnosis found.  No orders of the defined types were placed in this encounter.  No orders of the defined types were placed in this encounter.   No Follow-up on file.   Kristi Elayne Guerin, M.D. Primary Care at [facility] previously [facility] [address] [place], [place]  [address] [phone] phone [phone] fax

## 2017-01-02 ENCOUNTER — Other Ambulatory Visit: Payer: Self-pay | Admitting: Surgery

## 2017-01-02 ENCOUNTER — Ambulatory Visit (INDEPENDENT_AMBULATORY_CARE_PROVIDER_SITE_OTHER): Payer: Medicare Other | Admitting: Physician Assistant

## 2017-01-02 VITALS — BP 165/90 | HR 66 | Temp 97.9°F | Resp 18 | Ht 61.0 in | Wt 122.2 lb

## 2017-01-02 DIAGNOSIS — F988 Other specified behavioral and emotional disorders with onset usually occurring in childhood and adolescence: Secondary | ICD-10-CM | POA: Diagnosis not present

## 2017-01-02 DIAGNOSIS — F419 Anxiety disorder, unspecified: Secondary | ICD-10-CM

## 2017-01-02 DIAGNOSIS — F329 Major depressive disorder, single episode, unspecified: Secondary | ICD-10-CM

## 2017-01-02 DIAGNOSIS — E785 Hyperlipidemia, unspecified: Secondary | ICD-10-CM | POA: Diagnosis not present

## 2017-01-02 DIAGNOSIS — R197 Diarrhea, unspecified: Secondary | ICD-10-CM | POA: Diagnosis not present

## 2017-01-02 DIAGNOSIS — G4730 Sleep apnea, unspecified: Secondary | ICD-10-CM

## 2017-01-02 DIAGNOSIS — I10 Essential (primary) hypertension: Secondary | ICD-10-CM

## 2017-01-02 DIAGNOSIS — R9402 Abnormal brain scan: Secondary | ICD-10-CM

## 2017-01-02 DIAGNOSIS — Z7689 Persons encountering health services in other specified circumstances: Secondary | ICD-10-CM

## 2017-01-02 DIAGNOSIS — D0512 Intraductal carcinoma in situ of left breast: Secondary | ICD-10-CM

## 2017-01-02 DIAGNOSIS — Z23 Encounter for immunization: Secondary | ICD-10-CM | POA: Diagnosis not present

## 2017-01-02 DIAGNOSIS — F32A Depression, unspecified: Secondary | ICD-10-CM

## 2017-01-02 DIAGNOSIS — F418 Other specified anxiety disorders: Secondary | ICD-10-CM | POA: Diagnosis not present

## 2017-01-02 NOTE — Patient Instructions (Addendum)
Go ahead and do the Cologard.  Try adding a fiber supplement and/or an anti-diarrheal medication before eating when you will be away from home.    IF you received an x-ray today, you will receive an invoice from [facility]. Please contact [facility] at [phone] with questions or concerns regarding your invoice.   IF you received labwork today, you will receive an invoice from [facility]. Please contact [facility] at [phone] with questions or concerns regarding your invoice.   Our billing staff will not be able to assist you with questions regarding bills from these companies.  You will be contacted with the lab results as soon as they are available. The fastest way to get your results is to activate your My Chart account. Instructions are located on the last page of this paperwork. If you have not heard from [place] regarding the results in 2 weeks, please contact this office.
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
## 2017-01-03 ENCOUNTER — Ambulatory Visit (INDEPENDENT_AMBULATORY_CARE_PROVIDER_SITE_OTHER): Payer: Medicare Other | Admitting: Psychology

## 2017-01-03 DIAGNOSIS — F4323 Adjustment disorder with mixed anxiety and depressed mood: Secondary | ICD-10-CM | POA: Diagnosis not present

## 2017-01-03 DIAGNOSIS — F4310 Post-traumatic stress disorder, unspecified: Secondary | ICD-10-CM

## 2017-01-04 DIAGNOSIS — F341 Dysthymic disorder: Secondary | ICD-10-CM | POA: Diagnosis not present

## 2017-01-04 DIAGNOSIS — F4310 Post-traumatic stress disorder, unspecified: Secondary | ICD-10-CM | POA: Diagnosis not present

## 2017-01-04 DIAGNOSIS — F411 Generalized anxiety disorder: Secondary | ICD-10-CM | POA: Diagnosis not present

## 2017-01-07 ENCOUNTER — Encounter: Payer: Self-pay | Admitting: Oncology

## 2017-01-07 ENCOUNTER — Inpatient Hospital Stay: Payer: Medicare Other | Attending: Oncology | Admitting: Oncology

## 2017-01-07 VITALS — BP 169/92 | HR 64 | Temp 97.0°F | Resp 18 | Ht 62.0 in | Wt 124.2 lb

## 2017-01-07 DIAGNOSIS — I10 Essential (primary) hypertension: Secondary | ICD-10-CM | POA: Diagnosis not present

## 2017-01-07 DIAGNOSIS — Z79899 Other long term (current) drug therapy: Secondary | ICD-10-CM | POA: Diagnosis not present

## 2017-01-07 DIAGNOSIS — D0512 Intraductal carcinoma in situ of left breast: Secondary | ICD-10-CM

## 2017-01-07 DIAGNOSIS — F419 Anxiety disorder, unspecified: Secondary | ICD-10-CM | POA: Diagnosis not present

## 2017-01-07 DIAGNOSIS — G4730 Sleep apnea, unspecified: Secondary | ICD-10-CM | POA: Insufficient documentation

## 2017-01-07 DIAGNOSIS — G629 Polyneuropathy, unspecified: Secondary | ICD-10-CM | POA: Insufficient documentation

## 2017-01-07 DIAGNOSIS — F4310 Post-traumatic stress disorder, unspecified: Secondary | ICD-10-CM | POA: Diagnosis not present

## 2017-01-07 DIAGNOSIS — E785 Hyperlipidemia, unspecified: Secondary | ICD-10-CM | POA: Diagnosis not present

## 2017-01-07 DIAGNOSIS — M8580 Other specified disorders of bone density and structure, unspecified site: Secondary | ICD-10-CM | POA: Diagnosis not present

## 2017-01-07 NOTE — Progress Notes (Signed)
Patient here today as new evaluation regarding breast cancer ductal situ.  Referred by Dr. Garnette Gunner.
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
## 2017-01-09 DIAGNOSIS — F411 Generalized anxiety disorder: Secondary | ICD-10-CM | POA: Diagnosis not present

## 2017-01-09 DIAGNOSIS — F4310 Post-traumatic stress disorder, unspecified: Secondary | ICD-10-CM | POA: Diagnosis not present

## 2017-01-09 DIAGNOSIS — F341 Dysthymic disorder: Secondary | ICD-10-CM | POA: Diagnosis not present

## 2017-01-10 ENCOUNTER — Ambulatory Visit: Payer: Medicare Other | Admitting: Psychology

## 2017-01-10 ENCOUNTER — Ambulatory Visit (INDEPENDENT_AMBULATORY_CARE_PROVIDER_SITE_OTHER): Payer: Medicare Other | Admitting: Psychology

## 2017-01-10 DIAGNOSIS — F4323 Adjustment disorder with mixed anxiety and depressed mood: Secondary | ICD-10-CM

## 2017-01-10 DIAGNOSIS — F4310 Post-traumatic stress disorder, unspecified: Secondary | ICD-10-CM | POA: Diagnosis not present

## 2017-01-14 ENCOUNTER — Ambulatory Visit: Payer: Medicare Other | Admitting: Psychology

## 2017-01-16 DIAGNOSIS — F341 Dysthymic disorder: Secondary | ICD-10-CM | POA: Diagnosis not present

## 2017-01-16 DIAGNOSIS — F4310 Post-traumatic stress disorder, unspecified: Secondary | ICD-10-CM | POA: Diagnosis not present

## 2017-01-16 DIAGNOSIS — F411 Generalized anxiety disorder: Secondary | ICD-10-CM | POA: Diagnosis not present

## 2017-01-22 ENCOUNTER — Encounter
Admission: RE | Admit: 2017-01-22 | Discharge: 2017-01-22 | Disposition: A | Discharge status: Home / Self Care | Payer: Medicare Other | Source: Ambulatory Visit | Attending: Surgery | Admitting: Surgery

## 2017-01-22 DIAGNOSIS — Z01818 Encounter for other preprocedural examination: Secondary | ICD-10-CM | POA: Insufficient documentation

## 2017-01-22 DIAGNOSIS — D0512 Intraductal carcinoma in situ of left breast: Secondary | ICD-10-CM | POA: Insufficient documentation

## 2017-01-22 DIAGNOSIS — Z01812 Encounter for preprocedural laboratory examination: Secondary | ICD-10-CM | POA: Insufficient documentation

## 2017-01-22 DIAGNOSIS — I10 Essential (primary) hypertension: Secondary | ICD-10-CM | POA: Diagnosis not present

## 2017-01-22 DIAGNOSIS — Z01810 Encounter for preprocedural cardiovascular examination: Secondary | ICD-10-CM | POA: Diagnosis not present

## 2017-01-22 HISTORY — DX: Post-traumatic stress disorder, unspecified: F43.10

## 2017-01-22 LAB — BASIC METABOLIC PANEL
Anion gap: 8 (ref 5–15)
BUN: 14 mg/dL (ref 6–20)
CHLORIDE: 107 mmol/L (ref 101–111)
CO2: 26 mmol/L (ref 22–32)
Calcium: 9.5 mg/dL (ref 8.9–10.3)
Creatinine, Ser: 0.93 mg/dL (ref 0.44–1.00)
GFR calc Af Amer: >60 mL/min (ref >60)
GFR calc non Af Amer: >60 mL/min (ref >60)
GLUCOSE: 153 mg/dL — AB (ref 65–99)
POTASSIUM: 3.6 mmol/L (ref 3.5–5.1)
Sodium: 141 mmol/L (ref 135–145)

## 2017-01-22 LAB — CBC
HCT: 35.8 % (ref 35.0–47.0)
HEMOGLOBIN: 12 g/dL (ref 12.0–16.0)
MCH: 28.8 pg (ref 26.0–34.0)
MCHC: 33.6 g/dL (ref 32.0–36.0)
MCV: 85.7 fL (ref 80.0–100.0)
Platelets: 220 10*3/uL (ref 150–440)
RBC: 4.17 MIL/uL (ref 3.80–5.20)
RDW: 13.1 % (ref 11.5–14.5)
WBC: 5.9 10*3/uL (ref 3.6–11.0)

## 2017-01-22 NOTE — Patient Instructions (Signed)
  Your procedure is scheduled on: Friday, Feb 07, 2017 Report to Same Day Surgery 2nd floor medical mall ([place] [facility]-take elevator on left to 2nd floor.  Check in with surgery information desk.) To find out your arrival time please call [phone] between 1PM - 3PM on Thursday, May 3rd  Remember: Instructions that are not followed completely may result in serious medical risk, up to and including death, or upon the discretion of your surgeon and anesthesiologist your surgery may need to be rescheduled.    _x___ 1. Do not eat food or drink liquids after midnight. No gum chewing or hard candies.      __x__ 2. No Alcohol for 24 hours before or after surgery.   __x__3. No Smoking for 24 prior to surgery.   ____  4. Bring all medications with you on the day of surgery if instructed.    __x__ 5. Notify your doctor if there is any change in your medical condition     (cold, fever, infections).     Do not wear jewelry, make-up, hairpins, clips or nail polish.  Do not wear lotions, powders, or perfumes. You may wear deodorant.  Do not shave 48 hours prior to surgery. Men may shave face and neck.  Do not bring valuables to the hospital.    [facility] is not responsible for any belongings or valuables.     Patients discharged the day of surgery will not be allowed to drive home.  You will need someone to drive you home and stay with you the night of your procedure.    Please read over the following fact sheets that you were given:   [facility] Preparing for Surgery and or MRSA Information   _x___ Take these medications the morning of surgery with a SIP of water:   1. THYROID  2. LEXAPRO  3. BUSPIRONE     _x___ Use CHG Soap or sage wipes as directed on instruction sheet     _x___ Follow recommendations from Cardiologist, Pulmonologist or PCP regarding stopping Aspirin (STOP 1 WEEK PRIOR TO SURGERY.  X____Stop Anti-inflammatories such as Advil, Aleve, Ibuprofen, Motrin,  Naproxen, Naprosyn, Goodies powders or aspirin products. OK to take Tylenol.   _x___ Stop supplements ON April 25th until after surgery.  But may continue Vitamin D.

## 2017-01-23 DIAGNOSIS — F4310 Post-traumatic stress disorder, unspecified: Secondary | ICD-10-CM | POA: Diagnosis not present

## 2017-01-23 DIAGNOSIS — F341 Dysthymic disorder: Secondary | ICD-10-CM | POA: Diagnosis not present

## 2017-01-23 DIAGNOSIS — F411 Generalized anxiety disorder: Secondary | ICD-10-CM | POA: Diagnosis not present

## 2017-01-24 ENCOUNTER — Encounter: Payer: Self-pay | Admitting: Physician Assistant

## 2017-01-24 ENCOUNTER — Ambulatory Visit (INDEPENDENT_AMBULATORY_CARE_PROVIDER_SITE_OTHER): Payer: Medicare Other | Admitting: Psychology

## 2017-01-24 DIAGNOSIS — F4323 Adjustment disorder with mixed anxiety and depressed mood: Secondary | ICD-10-CM | POA: Diagnosis not present

## 2017-01-24 DIAGNOSIS — F4310 Post-traumatic stress disorder, unspecified: Secondary | ICD-10-CM | POA: Diagnosis not present

## 2017-01-25 ENCOUNTER — Other Ambulatory Visit: Payer: Self-pay

## 2017-01-25 MED ORDER — EZETIMIBE 10 MG PO TABS: 10.0000 mg | ORAL_TABLET | Freq: Every day | ORAL | 0 refills | Status: DC

## 2017-01-25 NOTE — Telephone Encounter (Signed)
MyChart request to refill  Ezetimibe x 90 days until appt in July -already made.

## 2017-01-29 DIAGNOSIS — F4310 Post-traumatic stress disorder, unspecified: Secondary | ICD-10-CM | POA: Diagnosis not present

## 2017-01-29 DIAGNOSIS — F341 Dysthymic disorder: Secondary | ICD-10-CM | POA: Diagnosis not present

## 2017-01-29 DIAGNOSIS — F411 Generalized anxiety disorder: Secondary | ICD-10-CM | POA: Diagnosis not present

## 2017-01-31 ENCOUNTER — Ambulatory Visit: Payer: Medicare Other

## 2017-02-02 NOTE — Progress Notes (Signed)
  This encounter was created in error - please disregard.  Patient rescheduled with another provider.  Left before undergoing evaluation by provider.

## 2017-02-03 ENCOUNTER — Telehealth: Payer: Self-pay | Admitting: *Deleted

## 2017-02-03 NOTE — Telephone Encounter (Signed)
Called pt because she has an appt 5/3 and she is suppose to see [place] after surgery.  She states she was worried about the appt because she is having surgery later that week.  I told her that I would change her appt and she has surgery f/u   I told her that I would call her and check  With radiation to change her appt and let her know.  I called Ana and they can't see her 5/15 and need to see her 5/17.  appt for [place]rystal 1:30 and see dr. Janese Banks 2:30.  I called pt back and left her a message and told the new date and times and if it does not work she can call me back.

## 2017-02-06 ENCOUNTER — Ambulatory Visit: Payer: Self-pay | Admitting: Oncology

## 2017-02-06 ENCOUNTER — Institutional Professional Consult (permissible substitution): Payer: Self-pay | Admitting: Radiation Oncology

## 2017-02-07 ENCOUNTER — Ambulatory Visit: Payer: Medicare Other | Admitting: Psychology

## 2017-02-07 ENCOUNTER — Encounter
Admission: RE | Disposition: A | Discharge status: Home / Self Care | Payer: Self-pay | Source: Ambulatory Visit | Attending: Surgery

## 2017-02-07 ENCOUNTER — Ambulatory Visit: Payer: Medicare Other | Admitting: Anesthesiology

## 2017-02-07 ENCOUNTER — Encounter: Payer: Self-pay | Admitting: Anesthesiology

## 2017-02-07 ENCOUNTER — Ambulatory Visit
Admission: RE | Admit: 2017-02-07 | Discharge: 2017-02-07 | Disposition: A | Discharge status: Home / Self Care | Payer: Medicare Other | Source: Ambulatory Visit | Attending: Surgery | Admitting: Surgery

## 2017-02-07 ENCOUNTER — Encounter
Admission: RE | Admit: 2017-02-07 | Discharge: 2017-02-07 | Disposition: A | Discharge status: Home / Self Care | Payer: Medicare Other | Source: Ambulatory Visit | Attending: Surgery | Admitting: Surgery

## 2017-02-07 DIAGNOSIS — G4730 Sleep apnea, unspecified: Secondary | ICD-10-CM | POA: Insufficient documentation

## 2017-02-07 DIAGNOSIS — Z9989 Dependence on other enabling machines and devices: Secondary | ICD-10-CM | POA: Diagnosis not present

## 2017-02-07 DIAGNOSIS — D0512 Intraductal carcinoma in situ of left breast: Secondary | ICD-10-CM | POA: Diagnosis not present

## 2017-02-07 DIAGNOSIS — E781 Pure hyperglyceridemia: Secondary | ICD-10-CM | POA: Diagnosis not present

## 2017-02-07 DIAGNOSIS — C50912 Malignant neoplasm of unspecified site of left female breast: Secondary | ICD-10-CM | POA: Diagnosis not present

## 2017-02-07 DIAGNOSIS — Z7982 Long term (current) use of aspirin: Secondary | ICD-10-CM | POA: Diagnosis not present

## 2017-02-07 DIAGNOSIS — N6012 Diffuse cystic mastopathy of left breast: Secondary | ICD-10-CM | POA: Diagnosis not present

## 2017-02-07 DIAGNOSIS — F909 Attention-deficit hyperactivity disorder, unspecified type: Secondary | ICD-10-CM | POA: Diagnosis not present

## 2017-02-07 DIAGNOSIS — I10 Essential (primary) hypertension: Secondary | ICD-10-CM | POA: Insufficient documentation

## 2017-02-07 DIAGNOSIS — Z79899 Other long term (current) drug therapy: Secondary | ICD-10-CM | POA: Insufficient documentation

## 2017-02-07 DIAGNOSIS — F419 Anxiety disorder, unspecified: Secondary | ICD-10-CM | POA: Insufficient documentation

## 2017-02-07 DIAGNOSIS — E039 Hypothyroidism, unspecified: Secondary | ICD-10-CM | POA: Insufficient documentation

## 2017-02-07 DIAGNOSIS — R921 Mammographic calcification found on diagnostic imaging of breast: Secondary | ICD-10-CM | POA: Diagnosis not present

## 2017-02-07 DIAGNOSIS — F329 Major depressive disorder, single episode, unspecified: Secondary | ICD-10-CM | POA: Diagnosis not present

## 2017-02-07 HISTORY — PX: SENTINEL NODE BIOPSY: SHX6608

## 2017-02-07 HISTORY — PX: PARTIAL MASTECTOMY WITH NEEDLE LOCALIZATION: SHX6008

## 2017-02-07 HISTORY — DX: Hypothyroidism, unspecified: E03.9

## 2017-02-07 HISTORY — PX: BREAST EXCISIONAL BIOPSY: SUR124

## 2017-02-07 LAB — URINE DRUG SCREEN, QUALITATIVE (ARMC ONLY)
Amphetamines, Ur Screen: NOT DETECTED
BARBITURATES, UR SCREEN: NOT DETECTED
Benzodiazepine, Ur Scrn: NOT DETECTED
Cannabinoid 50 Ng, Ur ~~LOC~~: POSITIVE — AB
Cocaine Metabolite,Ur ~~LOC~~: NOT DETECTED
MDMA (Ecstasy)Ur Screen: NOT DETECTED
METHADONE SCREEN, URINE: NOT DETECTED
OPIATE, UR SCREEN: NOT DETECTED
Phencyclidine (PCP) Ur S: NOT DETECTED
Tricyclic, Ur Screen: NOT DETECTED

## 2017-02-07 SURGERY — PARTIAL MASTECTOMY WITH NEEDLE LOCALIZATION
Anesthesia: General | Site: Breast | Laterality: Left | Wound class: Clean

## 2017-02-07 MED ORDER — FENTANYL CITRATE (PF) 100 MCG/2ML IJ SOLN
INTRAMUSCULAR | Status: DC | PRN
  Administered 2017-02-07 (×2): 50 ug via INTRAVENOUS

## 2017-02-07 MED ORDER — MIDAZOLAM HCL 2 MG/2ML IJ SOLN
INTRAMUSCULAR | Status: AC
  Filled 2017-02-07: qty 2

## 2017-02-07 MED ORDER — PROPOFOL 10 MG/ML IV BOLUS
INTRAVENOUS | Status: DC | PRN
  Administered 2017-02-07: 40 mg via INTRAVENOUS
  Administered 2017-02-07: 110 mg via INTRAVENOUS

## 2017-02-07 MED ORDER — EPHEDRINE SULFATE 50 MG/ML IJ SOLN
INTRAMUSCULAR | Status: AC
  Filled 2017-02-07: qty 1

## 2017-02-07 MED ORDER — LIDOCAINE HCL (CARDIAC) 20 MG/ML IV SOLN
INTRAVENOUS | Status: DC | PRN
  Administered 2017-02-07: 60 mg via INTRAVENOUS

## 2017-02-07 MED ORDER — FAMOTIDINE 20 MG PO TABS
20.0000 mg | ORAL_TABLET | Freq: Once | ORAL | Status: AC
  Administered 2017-02-07: 20 mg via ORAL

## 2017-02-07 MED ORDER — ONDANSETRON HCL 4 MG/2ML IJ SOLN
INTRAMUSCULAR | Status: DC | PRN
  Administered 2017-02-07: 4 mg via INTRAVENOUS

## 2017-02-07 MED ORDER — SODIUM CHLORIDE 0.9 % IJ SOLN
INTRAMUSCULAR | Status: AC
  Filled 2017-02-07: qty 10

## 2017-02-07 MED ORDER — EPHEDRINE SULFATE 50 MG/ML IJ SOLN
INTRAMUSCULAR | Status: DC | PRN
  Administered 2017-02-07: 15 mg via INTRAVENOUS
  Administered 2017-02-07 (×3): 10 mg via INTRAVENOUS

## 2017-02-07 MED ORDER — PHENYLEPHRINE HCL 10 MG/ML IJ SOLN
INTRAMUSCULAR | Status: AC
  Filled 2017-02-07: qty 1

## 2017-02-07 MED ORDER — HYDROCODONE-ACETAMINOPHEN 5-325 MG PO TABS: 1.0000 | ORAL_TABLET | ORAL | 0 refills | Status: DC | PRN

## 2017-02-07 MED ORDER — FENTANYL CITRATE (PF) 100 MCG/2ML IJ SOLN: 25.0000 ug | INTRAMUSCULAR | Status: DC | PRN

## 2017-02-07 MED ORDER — FAMOTIDINE 20 MG PO TABS
ORAL_TABLET | ORAL | Status: AC
  Filled 2017-02-07: qty 1

## 2017-02-07 MED ORDER — GLYCOPYRROLATE 0.2 MG/ML IJ SOLN
INTRAMUSCULAR | Status: DC | PRN
  Administered 2017-02-07: 0.2 mg via INTRAVENOUS

## 2017-02-07 MED ORDER — BUPIVACAINE-EPINEPHRINE 0.5% -1:200000 IJ SOLN
INTRAMUSCULAR | Status: DC | PRN
  Administered 2017-02-07: 10 mL

## 2017-02-07 MED ORDER — DEXAMETHASONE SODIUM PHOSPHATE 10 MG/ML IJ SOLN
INTRAMUSCULAR | Status: AC
  Filled 2017-02-07: qty 1

## 2017-02-07 MED ORDER — GLYCOPYRROLATE 0.2 MG/ML IJ SOLN
INTRAMUSCULAR | Status: AC
  Filled 2017-02-07: qty 1

## 2017-02-07 MED ORDER — ONDANSETRON HCL 4 MG/2ML IJ SOLN
INTRAMUSCULAR | Status: AC
  Filled 2017-02-07: qty 2

## 2017-02-07 MED ORDER — DEXAMETHASONE SODIUM PHOSPHATE 10 MG/ML IJ SOLN
INTRAMUSCULAR | Status: DC | PRN
  Administered 2017-02-07: 5 mg via INTRAVENOUS

## 2017-02-07 MED ORDER — HYDROCODONE-ACETAMINOPHEN 5-325 MG PO TABS: 1.0000 | ORAL_TABLET | ORAL | Status: DC | PRN

## 2017-02-07 MED ORDER — MIDAZOLAM HCL 2 MG/2ML IJ SOLN
INTRAMUSCULAR | Status: DC | PRN
  Administered 2017-02-07: 2 mg via INTRAVENOUS

## 2017-02-07 MED ORDER — LACTATED RINGERS IV SOLN
INTRAVENOUS | Status: DC
  Administered 2017-02-07: 75 mL/h via INTRAVENOUS

## 2017-02-07 MED ORDER — FENTANYL CITRATE (PF) 100 MCG/2ML IJ SOLN
INTRAMUSCULAR | Status: AC
  Filled 2017-02-07: qty 2

## 2017-02-07 MED ORDER — ONDANSETRON HCL 4 MG/2ML IJ SOLN: 4.0000 mg | Freq: Once | INTRAMUSCULAR | Status: DC | PRN

## 2017-02-07 MED ORDER — LIDOCAINE HCL (PF) 2 % IJ SOLN
INTRAMUSCULAR | Status: AC
  Filled 2017-02-07: qty 2

## 2017-02-07 MED ORDER — TECHNETIUM TC 99M SULFUR COLLOID FILTERED
1.0800 | Freq: Once | INTRAVENOUS | Status: AC | PRN
  Administered 2017-02-07: 1.08 via INTRADERMAL

## 2017-02-07 MED ORDER — BUPIVACAINE-EPINEPHRINE (PF) 0.5% -1:200000 IJ SOLN
INTRAMUSCULAR | Status: AC
  Filled 2017-02-07: qty 30

## 2017-02-07 MED ORDER — PHENYLEPHRINE HCL 10 MG/ML IJ SOLN
INTRAMUSCULAR | Status: DC | PRN
  Administered 2017-02-07: 100 ug via INTRAVENOUS
  Administered 2017-02-07 (×2): 50 ug via INTRAVENOUS

## 2017-02-07 MED ORDER — PROPOFOL 10 MG/ML IV BOLUS
INTRAVENOUS | Status: AC
  Filled 2017-02-07: qty 20

## 2017-02-07 SURGICAL SUPPLY — 36 items
BLADE SURG 15 STRL LF DISP TIS (BLADE) ×2 IMPLANT
BLADE SURG 15 STRL SS (BLADE) ×1
CANISTER SUCT 1200ML W/VALVE (MISCELLANEOUS) ×3 IMPLANT
CHLORAPREP W/TINT 26ML (MISCELLANEOUS) ×3 IMPLANT
CNTNR SPEC 2.5X3XGRAD LEK (MISCELLANEOUS) ×2
CONT SPEC 4OZ STER OR WHT (MISCELLANEOUS) ×1
CONTAINER SPEC 2.5X3XGRAD LEK (MISCELLANEOUS) ×2 IMPLANT
DERMABOND ADVANCED (GAUZE/BANDAGES/DRESSINGS) ×1
DERMABOND ADVANCED .7 DNX12 (GAUZE/BANDAGES/DRESSINGS) ×2 IMPLANT
DEVICE DUBIN SPECIMEN MAMMOGRA (MISCELLANEOUS) ×3 IMPLANT
DRAPE LAPAROTOMY 77X122 PED (DRAPES) ×3 IMPLANT
ELECT REM PT RETURN 9FT ADLT (ELECTROSURGICAL) ×3
ELECTRODE REM PT RTRN 9FT ADLT (ELECTROSURGICAL) ×2 IMPLANT
GLOVE BIO SURGEON STRL SZ7 (GLOVE) ×6 IMPLANT
GLOVE BIO SURGEON STRL SZ7.5 (GLOVE) ×3 IMPLANT
GLOVE BIOGEL PI IND STRL 7.0 (GLOVE) ×4 IMPLANT
GLOVE BIOGEL PI INDICATOR 7.0 (GLOVE) ×2
GOWN STRL REUS W/ TWL LRG LVL3 (GOWN DISPOSABLE) ×8 IMPLANT
GOWN STRL REUS W/TWL LRG LVL3 (GOWN DISPOSABLE) ×4
KIT RM TURNOVER STRD PROC AR (KITS) ×3 IMPLANT
LABEL OR SOLS (LABEL) ×3 IMPLANT
MARGIN MAP 10MM (MISCELLANEOUS) ×3 IMPLANT
NDL SAFETY 18GX1.5 (NEEDLE) ×3 IMPLANT
NDL SAFETY 22GX1.5 (NEEDLE) ×3 IMPLANT
NEEDLE HYPO 25X1 1.5 SAFETY (NEEDLE) ×3 IMPLANT
PACK BASIN MINOR ARMC (MISCELLANEOUS) ×3 IMPLANT
SLEVE PROBE SENORX GAMMA FIND (MISCELLANEOUS) ×3 IMPLANT
SUT CHROMIC 3 0 SH 27 (SUTURE) IMPLANT
SUT CHROMIC 4 0 RB 1X27 (SUTURE) ×3 IMPLANT
SUT ETHILON 3-0 FS-10 30 BLK (SUTURE) ×3
SUT MNCRL 4-0 (SUTURE) ×1
SUT MNCRL 4-0 27XMFL (SUTURE) ×2
SUTURE EHLN 3-0 FS-10 30 BLK (SUTURE) ×2 IMPLANT
SUTURE MNCRL 4-0 27XMF (SUTURE) ×2 IMPLANT
SYRINGE 10CC LL (SYRINGE) ×3 IMPLANT
WATER STERILE IRR 1000ML POUR (IV SOLUTION) ×3 IMPLANT

## 2017-02-07 NOTE — Op Note (Signed)
OPERATIVE REPORT  PREOPERATIVE  DIAGNOSIS: . 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 Left partial mastectomy with axillary sentinel lymph node biopsy  ANESTHESIA:  General  SURGEON: Rochel Brome  MD   INDICATIONS: . She had recent mammogram findings of microcalcifications in the left breast. Stereotactic needle biopsy demonstrated ductal carcinoma in situ. Surgery was recommended for definitive treatment. She did have preoperative insertion of Kopan's wire. Mammogram images were reviewed. She also had injection of radioactive technetium sulfur colloid.  With the patient on the operating table in the supine position the left arm was placed on a lateral arm support. The dressing was removed from the left breast exposing the Kopan's wire in the upper outer quadrant. The wire was cut 2 cm from the skin. The breast and axilla and chest wall and upper arm were prepared with ChloraPrep and draped in a sterile manner.  A curvilinear incision was made from 1:00 to 3:00 position of the left breast 6 cm from the nipple. A narrow ellipse of skin was excised. Dissection was carried down through subcutaneous tissues. Electrocautery was used for hemostasis. One clamped bleeding point was suture ligated with 4-0 chromic. A mass of breast tissue surrounding the Kopan's wire was excised. The excision was carried down as far as the deep fascia. The specimen was marked with margin maps suturing markers to the cranial caudal medial lateral superficial and deep margins. Specimen was submitted for specimen mammogram demonstrating the location of the biopsy marker and calcifications. The pathologist examined this and called to report that the biopsy marker was centrally located in the specimen and margins appeared to be satisfactory. The wound was inspected and could see hemostasis was intact.  The  axilla was probed with a gamma counter demonstrating the location of radioactivity. An oblique incision was made in the inferior aspect of the axilla and dissection was carried down through subcutaneous tissues using electrocautery for hemostasis. Radioactivity was found in the inferior aspect of the axilla adjacent to the rib cage. A lymph node was dissected free from surrounding tissues. It was approximately 10 mm in dimension. The ex vivo counts per second was in the range of 350 to 380 . The background count was less than 14 counts per second. There was no remaining palpable mass within the axilla. Hemostasis was intact.  The partial mastectomy wound was further inspected and subcutaneous tissues and deeper tissues were infiltrated with half percent Sensorcaine with epinephrine. Subcutaneous tissues were approximated with interrupted 4-0 chromic sutures. The skin was closed with running 4-0 Monocryl subcuticular suture.  The axillary wound was examined and could see hemostasis was intact. The subcutaneous tissues were infiltrated with half percent Sensorcaine with epinephrine. The skin was closed with running 4-0 Monocryl subcuticular suture. Both wounds were treated with Dermabond and allowed to dry. The patient tolerated surgery satisfactorily and was then prepared for transfer to the recovery room  [facility].D.

## 2017-02-07 NOTE — Anesthesia Preprocedure Evaluation (Addendum)
Anesthesia Evaluation  Patient identified by MRN, date of birth, ID band Patient awake    Reviewed: Allergy & Precautions, NPO status , Patient's Chart, lab work & pertinent test results  Airway Mallampati: III  TM Distance: <3 FB     Dental   Pulmonary sleep apnea ,    Pulmonary exam normal        Cardiovascular hypertension, Pt. on medications Normal cardiovascular exam     Neuro/Psych PSYCHIATRIC DISORDERS Anxiety Depression ADHD Neuromuscular disease    GI/Hepatic negative GI ROS, Neg liver ROS,   Endo/Other  Hypothyroidism   Renal/GU negative Renal ROS  negative genitourinary   Musculoskeletal  (+) Arthritis , Osteoarthritis,    Abdominal Normal abdominal exam  (+)   Peds negative pediatric ROS (+)  Hematology  (+) anemia ,   Anesthesia Other Findings Past Medical History: No date: ADHD No date: Allergy No date: Anemia No date: Anxiety No date: Arthritis 12/2016: Cancer ([place])     Comment: LEFT breast, DCIS No date: Cataract No date: Chicken pox No date: Cognitive decline No date: Depression No date: Hypertension No date: Hypertriglyceridemia No date: Neuromuscular disorder (H[facility]) No date: Neuropathy No date: Post traumatic stress disorder No date: Sleep apnea No date: Thyroid disease  Reproductive/Obstetrics                            Anesthesia Physical Anesthesia Plan  ASA: II  Anesthesia Plan: General   Post-op Pain Management:    Induction: Intravenous  Airway Management Planned: LMA  Additional Equipment:   Intra-op Plan:   Post-operative Plan: Extubation in OR  Informed Consent: I have reviewed the patients History and Physical, chart, labs and discussed the procedure including the risks, benefits and alternatives for the proposed anesthesia with the patient or authorized representative who has indicated his/her understanding and acceptance.     Plan  Discussed with: CRNA and Surgeon  Anesthesia Plan Comments:        Anesthesia Quick Evaluation

## 2017-02-07 NOTE — Discharge Instructions (Signed)
Take Tylenol or Norco if needed for pain.  Should not drive or do anything dangerous when taking Norco.  Wear bra as desired for comfort and support.  May shower.

## 2017-02-07 NOTE — Anesthesia Post-op Follow-up Note (Cosign Needed)
Anesthesia QCDR form completed.        

## 2017-02-07 NOTE — Transfer of Care (Signed)
Immediate Anesthesia Transfer of Care Note  Patient: Angela Murphy  Procedure(s) Performed: Procedure(s): PARTIAL MASTECTOMY WITH NEEDLE LOCALIZATION (Left) SENTINEL NODE BIOPSY (Left)  Patient Location: PACU  Anesthesia Type:General  Level of Consciousness: drowsy and patient cooperative  Airway & Oxygen Therapy: Patient Spontanous Breathing and Patient connected to face mask oxygen  Post-op Assessment: Report given to RN, Post -op Vital signs reviewed and stable and Patient moving all extremities X 4  Post vital signs: Reviewed and stable  Last Vitals:  Vitals:   02/07/17 0943 02/07/17 1257  BP: 128/66 (!) 120/55  Pulse: (!) 47 84  Resp: 15 12  Temp: (!) 35.8 C 37 C    Last Pain:  Vitals:   02/07/17 1257  PainSc: Asleep      Patients Stated Pain Goal: 0 (41/03/01 3143)  Complications: No apparent anesthesia complications

## 2017-02-07 NOTE — Anesthesia Postprocedure Evaluation (Signed)
Anesthesia Post Note  Patient: Angela Murphy  Procedure(s) Performed: Procedure(s) (LRB): PARTIAL MASTECTOMY WITH NEEDLE LOCALIZATION (Left) SENTINEL NODE BIOPSY (Left)  Patient location during evaluation: PACU Anesthesia Type: General Level of consciousness: awake and alert and oriented Pain management: pain level controlled Vital Signs Assessment: post-procedure vital signs reviewed and stable Respiratory status: spontaneous breathing Cardiovascular status: blood pressure returned to baseline Anesthetic complications: no     Last Vitals:  Vitals:   02/07/17 0943 02/07/17 1257  BP: 128/66 (!) 120/55  Pulse: (!) 47 84  Resp: 15 12  Temp: (!) 35.8 C 37 C    Last Pain:  Vitals:   02/07/17 1257  PainSc: Asleep                 Brannon Levene

## 2017-02-07 NOTE — H&P (Signed)
Angela Murphy is an 71 y.o. female.   Chief Complaint: Breast cancer HPI: She had bilateral screening mammograms on 11/12/2016 demonstrating calcifications in the left breast. On 12/11/2016 she had diagnostic left mammogram. This demonstrated a 4.4 x 3.2 x 2.2 cm area of fine pleomorphic calcifications within the upper outer quadrant of the left breast posterior depth. She had stereotactic biopsy on 12/25/2016. A top hat shaped biopsy marking clip was placed. 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  She reports no history of breast pain or nipple discharge or palpable mass. She has never had a prior breast biopsy other than that noted above.  She was seen in the office and scheduled for surgery.'s morning she did have insertion of Kopan's wire. I have reviewed the mammogram images with the wire in place seen in the proximity of the wire and the biopsy marker and calcifications. She also has had injection of radioactive technetium sulfur colloid.      Past Medical History:  Diagnosis Date  . ADHD   . Allergy   . Anemia   . Anxiety   . Arthritis   . Cancer ([place]) 12/2016   LEFT breast, DCIS  . Cataract   . Chicken pox   . Cognitive decline   . Depression   . Hypertension   . Hypertriglyceridemia   . Hypothyroidism   . Neuromuscular disorder ([place])    peripheral neuropathy  . Neuropathy   . Post traumatic stress disorder   . Sleep apnea    uses cpap  . Thyroid disease     Past Surgical History:  Procedure Laterality Date  . BREAST BIOPSY Left 12/25/2016   DCIS   . BREAST EXCISIONAL BIOPSY Left 02/07/2017   lumpectomy DCIS   . CATARACT EXTRACTION W/ INTRAOCULAR LENS  IMPLANT, BILATERAL    . EYE SURGERY Bilateral   . THYROIDECTOMY, PARTIAL  1974  . TONSILLECTOMY      Family History  Problem Relation Age of Onset  . Arthritis Mother   . Heart disease Mother   . Heart disease Father   . Mental illness Maternal Uncle    . Arthritis Maternal Grandmother   . Heart disease Maternal Grandmother   . Alcohol abuse Maternal Grandfather   . Heart disease Maternal Grandfather   . Heart disease Paternal Grandmother   . Heart disease Paternal Grandfather    Social History:  reports that she has never smoked. She has never used smokeless tobacco. She reports that she drinks alcohol. She reports that she uses drugs, including Marijuana.  Allergies:  Allergies  Allergen Reactions  . Seasonal Ic [Cholestatin] Other (See Comments)    Runny nose, itchy eyes    Medications Prior to Admission  Medication Sig Dispense Refill  . amphetamine-dextroamphetamine (ADDERALL) 20 MG tablet Take 10 mg by mouth daily.     Marland Kitchen aspirin 81 MG chewable tablet Chew 1 tablet (81 mg total) by mouth daily. 30 tablet 0  . busPIRone (BUSPAR) 30 MG tablet TAKE ONE TABLET BY MOUTH TWICE DAILY 180 tablet 2  . Cholecalciferol (VITAMIN D3) 5000 units TABS Take 5,000 Units by mouth daily with breakfast.     . escitalopram (LEXAPRO) 10 MG tablet Take 1 tablet (10 mg total) by mouth daily. 90 tablet 1  . ezetimibe (ZETIA) 10 MG tablet Take 1 tablet (10 mg total) by mouth at bedtime. 90 tablet 0  . fexofenadine (ALLEGRA) 180 MG tablet Take 180 mg by mouth daily with breakfast.    .  Glucosamine HCl-MSM 750-750 MG TABS Take 1 tablet by mouth 2 (two) times daily in the am and at bedtime..     . lisinopril (PRINIVIL,ZESTRIL) 20 MG tablet TAKE ONE TABLET BY MOUTH ONCE DAILY (Patient taking differently: TAKE ONE TABLET BY MOUTH AT BEDTIME) 90 tablet 1  . Misc Natural Products (ENERGY FOCUS) TABS Take 2 tablets by mouth 2 (two) times daily.     Marland Kitchen OVER THE COUNTER MEDICATION Take 1 capsule by mouth daily with breakfast. Bacopa     . OVER THE COUNTER MEDICATION Take 2 capsules by mouth 2 (two) times daily. Serotonin Mood Enhance    . Specialty Vitamins Products (BRAIN) TABS Take 1 each by mouth 2 (two) times daily. Brain and Body power MAX     . thyroid  (ARMOUR) 90 MG tablet Take 90 mg by mouth daily before breakfast.     . traZODone (DESYREL) 50 MG tablet Take 50 mg by mouth at bedtime as needed for sleep.     Marland Kitchen zinc gluconate 50 MG tablet Take 50 mg by mouth at bedtime.     Marland Kitchen OVER THE COUNTER MEDICATION Take 2 tablets by mouth 2 (two) times daily in the am and at bedtime.. Calcium supplement    She has not taken her aspirin over the last 2 weeks.  Results for orders placed or performed during the hospital encounter of 02/07/17 (from the past 48 hour(s))  Urine Drug Screen, Qualitative ([place] only)     Status: Abnormal   Collection Time: 02/07/17  9:28 AM  Result Value Ref Range   Tricyclic, Ur Screen NONE DETECTED NONE DETECTED   Amphetamines, Ur Screen NONE DETECTED NONE DETECTED   MDMA (Ecstasy)Ur Screen NONE DETECTED NONE DETECTED   Cocaine Metabolite,Ur [place] NONE DETECTED NONE DETECTED   Opiate, Ur Screen NONE DETECTED NONE DETECTED   Phencyclidine (PCP) Ur S NONE DETECTED NONE DETECTED   Cannabinoid 50 Ng, Ur [place] POSITIVE (A) NONE DETECTED   Barbiturates, Ur Screen NONE DETECTED NONE DETECTED   Benzodiazepine, Ur Scrn NONE DETECTED NONE DETECTED   Methadone Scn, Ur NONE DETECTED NONE DETECTED    Comment: (NOTE) 160  Tricyclics, urine               Cutoff 1000 ng/mL 200  Amphetamines, urine             Cutoff 1000 ng/mL 300  MDMA (Ecstasy), urine           Cutoff 500 ng/mL 400  Cocaine Metabolite, urine       Cutoff 300 ng/mL 500  Opiate, urine                   Cutoff 300 ng/mL 600  Phencyclidine (PCP), urine      Cutoff 25 ng/mL 700  Cannabinoid, urine              Cutoff 50 ng/mL 800  Barbiturates, urine             Cutoff 200 ng/mL 900  Benzodiazepine, urine           Cutoff 200 ng/mL 1000 Methadone, urine                Cutoff 300 ng/mL 1100 1200 The urine drug screen provides only a preliminary, unconfirmed 1300 analytical test result and should not be used for non-medical 1400 purposes. Clinical consideration and  professional judgment should 1500 be applied to any positive drug screen result due to possible 1600 interfering  substances. A more specific alternate chemical method 1700 must be used in order to obtain a confirmed analytical result.  1800 Gas chromato graphy / mass spectrometry (GC/MS) is the preferred 1900 confirmatory method.    Nm Sentinel Node Injection  Result Date: 02/07/2017 CLINICAL DATA:  Left breast cancer. EXAM: NUCLEAR MEDICINE BREAST LYMPHOSCINTIGRAPHY TECHNIQUE: The left nipple and surrounding skin was prepped with chlorhexidine. Intradermal injection of radiopharmaceutical was performed at the 12 o'clock, 3 o'clock, 6 o'clock, and 9 o'clock positions around the left nipple. The patient was then sent to the operating room where the sentinel node(s) were identified and removed by the surgeon. RADIOPHARMACEUTICALS:  Total of 1 mCi Millipore-filtered Technetium-59msulfur colloid, injected in four aliquots of 0.25 mCi each. IMPRESSION: Uncomplicated intradermal injection of a total of 1 mCi Technetium-962mulfur colloid for purposes of sentinel node identification. Electronically Signed   By: AdMarkus Daft.D.   On: 02/07/2017 10:14   Mm Lt Plc Breast Loc Dev   1st Lesion  Inc Mammo Guide  Result Date: 02/07/2017 CLINICAL DATA:  Recent diagnosis of ductal carcinoma in situ of the left breast following stereotactic biopsy of calcifications in the upper-outer quadrant. EXAM: NEEDLE LOCALIZATION OF THE LEFT BREAST WITH MAMMO GUIDANCE COMPARISON:  Previous exams. FINDINGS: Patient presents for needle localization prior to lumpectomy. I met with the patient and we discussed the procedure of needle localization including benefits and alternatives. We discussed the high likelihood of a successful procedure. We discussed the risks of the procedure, including infection, bleeding, tissue injury, and further surgery. Informed, written consent was given. The usual time-out protocol was performed  immediately prior to the procedure. Using mammographic guidance, sterile technique, 1% lidocaine and a 7 cm modified Kopans needle, residual calcifications and a biopsy clip were localized using lateral to medial approach. The images were marked for Dr. SmTamala JulianIMPRESSION: Needle localization left breast. No apparent complications. Electronically Signed   By: SuCurlene Dolphin.D.   On: 02/07/2017 09:03    ROS. She reports no recent illnesses since the office visit. She has been eating satisfactorily. She has not noted any swollen glands. She has been breathing satisfactorily. She said no recent chest pains. Review of systems otherwise negative  Blood pressure 128/66, pulse (!) 47, temperature (!) 96.5 F (35.8 C), resp. rate 15, height _0  (1.575 m), weight 56.2 kg (124 lb), SpO2 100 %. Physical Exam  GENERAL:  Awake alert and oriented and in no acute distress.  HEENT:  Head is normocephalic.  Pupils are equal reactive to light.  Extraocular movements are intact. Sclera is clear.  Pharynx is clear.  NECK:  Supple with no palpable mass and no adenopathy.  LUNGS:  Clear without rales rhonchi or wheezes.  HEART:  Regular rhythm S1-S2, without murmur.  BREAST: There is a dressing over the left breast.  EXTREMITIES:  Well-developed well-nourished with no dependent edema. She has below-the-knee support stockings.   NEUROLOGIC:  Awake alert and moving all extremities.   CLINICAL DATA: Recent metabolic panel with an elevated glucose at 153, creatinine 0.93. CBC with a hemoglobin of 12.0 and platelet count 220,000.   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  I discussed the plan for left partial mastectomy and sentinel lymph node biopsy.  WiRochel BromeMD 02/07/2017, 10:26 AM

## 2017-02-07 NOTE — Anesthesia Procedure Notes (Signed)
Procedure Name: LMA Insertion Date/Time: 02/07/2017 11:12 AM Performed by: Silvana Newness Pre-anesthesia Checklist: Patient identified, Emergency Drugs available, Suction available, Patient being monitored and Timeout performed Patient Re-evaluated:Patient Re-evaluated prior to inductionOxygen Delivery Method: Circle system utilized Preoxygenation: Pre-oxygenation with 100% oxygen Intubation Type: IV induction Ventilation: Mask ventilation without difficulty LMA: LMA inserted LMA Size: 4.0 Number of attempts: 1 Placement Confirmation: positive ETCO2 and breath sounds checked- equal and bilateral Tube secured with: Tape Dental Injury: Teeth and Oropharynx as per pre-operative assessment

## 2017-02-10 ENCOUNTER — Other Ambulatory Visit: Payer: Self-pay | Admitting: Pathology

## 2017-02-12 DIAGNOSIS — F4310 Post-traumatic stress disorder, unspecified: Secondary | ICD-10-CM | POA: Diagnosis not present

## 2017-02-12 DIAGNOSIS — F411 Generalized anxiety disorder: Secondary | ICD-10-CM | POA: Diagnosis not present

## 2017-02-12 DIAGNOSIS — F341 Dysthymic disorder: Secondary | ICD-10-CM | POA: Diagnosis not present

## 2017-02-17 DIAGNOSIS — D0512 Intraductal carcinoma in situ of left breast: Secondary | ICD-10-CM | POA: Diagnosis not present

## 2017-02-17 DIAGNOSIS — F411 Generalized anxiety disorder: Secondary | ICD-10-CM | POA: Diagnosis not present

## 2017-02-17 DIAGNOSIS — F4310 Post-traumatic stress disorder, unspecified: Secondary | ICD-10-CM | POA: Diagnosis not present

## 2017-02-17 DIAGNOSIS — F341 Dysthymic disorder: Secondary | ICD-10-CM | POA: Diagnosis not present

## 2017-02-18 NOTE — Progress Notes (Signed)
  Oncology Nurse Navigator Documentation  Navigator Location: [facility] (02/18/17 1100)   )Navigator Encounter Type: Telephone (02/18/17 1100) Telephone: Lahoma Crocker Call;Appt Confirmation/Clarification (02/18/17 1100)                     Treatment Phase: Active Tx (02/18/17 1100) Barriers/Navigation Needs: Coordination of Care (02/18/17 1100)   Interventions: Coordination of Care (02/18/17 1100)   Coordination of Care: Appts (02/18/17 1100)                  Time Spent with Patient: 30 (02/18/17 1100)  Patient discussed in Case Conference. She saw Dr. Tamala Julian post-op yesterday. Requires further surgery, and this is scheduled for 02/27/17.  Phoned patient as follow-up and discussed need to move follow-up with Dr. Janese Banks, and Consult with Dr. Baruch Gouty.  Patient agreeable.  Appointments moved to 03/06/17, and patient notified

## 2017-02-20 ENCOUNTER — Ambulatory Visit: Payer: Self-pay | Admitting: Oncology

## 2017-02-20 ENCOUNTER — Ambulatory Visit: Payer: Medicare Other | Admitting: Radiation Oncology

## 2017-02-20 ENCOUNTER — Encounter: Payer: Self-pay | Admitting: Physician Assistant

## 2017-02-21 ENCOUNTER — Ambulatory Visit (INDEPENDENT_AMBULATORY_CARE_PROVIDER_SITE_OTHER): Payer: Medicare Other | Admitting: Psychology

## 2017-02-21 ENCOUNTER — Inpatient Hospital Stay: Admission: RE | Admit: 2017-02-21 | Payer: Self-pay | Source: Ambulatory Visit

## 2017-02-21 DIAGNOSIS — F4310 Post-traumatic stress disorder, unspecified: Secondary | ICD-10-CM | POA: Diagnosis not present

## 2017-02-21 DIAGNOSIS — F4323 Adjustment disorder with mixed anxiety and depressed mood: Secondary | ICD-10-CM | POA: Diagnosis not present

## 2017-02-21 MED ORDER — THYROID 90 MG PO TABS: 90.0000 mg | ORAL_TABLET | Freq: Every day | ORAL | 1 refills | Status: DC

## 2017-02-24 ENCOUNTER — Encounter
Admission: RE | Admit: 2017-02-24 | Discharge: 2017-02-24 | Disposition: A | Discharge status: Home / Self Care | Payer: Medicare Other | Source: Ambulatory Visit | Attending: Surgery | Admitting: Surgery

## 2017-02-24 NOTE — Patient Instructions (Addendum)
  Your procedure is scheduled on: 02-27-17  Report to Same Day Surgery 2nd floor medical mall [facility] Entrance-take elevator on left to 2nd floor.  Check in with surgery information desk.) To find out your arrival time please call [phone] between 1PM - 3PM on 02-26-17  Remember: Instructions that are not followed completely may result in serious medical risk, up to and including death, or upon the discretion of your surgeon and anesthesiologist your surgery may need to be rescheduled.    _x___ 1. Do not eat food or drink liquids after midnight. No gum chewing or hard candies.     __x__ 2. No Alcohol for 24 hours before or after surgery.   __x__3. No Smoking for 24 prior to surgery.   ____  4. Bring all medications with you on the day of surgery if instructed.    __x__ 5. Notify your doctor if there is any change in your medical condition     (cold, fever, infections).     Do not wear jewelry, make-up, hairpins, clips or nail polish.  Do not wear lotions, powders, or perfumes. You may wear deodorant.  Do not shave 48 hours prior to surgery. Men may shave face and neck.  Do not bring valuables to the hospital.    [facility] is not responsible for any belongings or valuables.               Contacts, dentures or bridgework may not be worn into surgery.  Leave your suitcase in the car. After surgery it may be brought to your room.  For patients admitted to the hospital, discharge time is determined by your treatment team.   Patients discharged the day of surgery will not be allowed to drive home.  You will need someone to drive you home and stay with you the night of your procedure.    Please read over the following fact sheets that you were given:     _x___ Take anti-hypertensive (unless it includes a diuretic), cardiac, seizure, asthma,     anti-reflux and psychiatric medicines. These include:  1. BUSPAR  2. ALLEGRA  3. LEXAPRO  4. ARMOUR THYROID  5.  6.  ____Fleets  enema or Magnesium Citrate as directed.   ____ Use CHG Soap or sage wipes as directed on instruction sheet   ____ Use inhalers on the day of surgery and bring to hospital day of surgery  ____ Stop Metformin and Janumet 2 days prior to surgery.    ____ Take 1/2 of usual insulin dose the night before surgery and none on the morning     surgery.   _x___ Follow recommendations from Cardiologist, Pulmonologist or PCP regarding stopping Aspirin, Coumadin, Pllavix ,Eliquis, Effient, or Pradaxa, and Pletal-PT HAS NOT STARTED ASA BACK SINCE HER LAST SURGERY ON 02-07-17  X____Stop Anti-inflammatories such as Advil, Aleve, IBUPROFEN, Motrin, Naproxen, Naprosyn, Goodies powders or aspirin products NOW-OK to take Tylenol    _x___ Stop supplements until after surgery-STOP FISH OIL, GLUCOSAMINE-CHONDROITIN, BUCOPA AND MUCUNA PURENS NOW   ____ Bring C-Pap to the hospital.

## 2017-02-26 MED ORDER — LEVOFLOXACIN IN D5W 500 MG/100ML IV SOLN
INTRAVENOUS | Status: AC
  Filled 2017-02-26: qty 100

## 2017-02-27 ENCOUNTER — Ambulatory Visit: Payer: Medicare Other | Admitting: Registered Nurse

## 2017-02-27 ENCOUNTER — Ambulatory Visit
Admission: RE | Admit: 2017-02-27 | Discharge: 2017-02-27 | Disposition: A | Discharge status: Home / Self Care | Payer: Medicare Other | Source: Ambulatory Visit | Attending: Surgery | Admitting: Surgery

## 2017-02-27 ENCOUNTER — Encounter: Payer: Self-pay | Admitting: *Deleted

## 2017-02-27 ENCOUNTER — Encounter
Admission: RE | Disposition: A | Discharge status: Home / Self Care | Payer: Self-pay | Source: Ambulatory Visit | Attending: Surgery

## 2017-02-27 DIAGNOSIS — M1990 Unspecified osteoarthritis, unspecified site: Secondary | ICD-10-CM | POA: Diagnosis not present

## 2017-02-27 DIAGNOSIS — I10 Essential (primary) hypertension: Secondary | ICD-10-CM | POA: Diagnosis not present

## 2017-02-27 DIAGNOSIS — F419 Anxiety disorder, unspecified: Secondary | ICD-10-CM | POA: Diagnosis not present

## 2017-02-27 DIAGNOSIS — D0512 Intraductal carcinoma in situ of left breast: Secondary | ICD-10-CM | POA: Insufficient documentation

## 2017-02-27 DIAGNOSIS — G4730 Sleep apnea, unspecified: Secondary | ICD-10-CM | POA: Diagnosis not present

## 2017-02-27 DIAGNOSIS — F909 Attention-deficit hyperactivity disorder, unspecified type: Secondary | ICD-10-CM | POA: Insufficient documentation

## 2017-02-27 DIAGNOSIS — E781 Pure hyperglyceridemia: Secondary | ICD-10-CM | POA: Insufficient documentation

## 2017-02-27 DIAGNOSIS — E079 Disorder of thyroid, unspecified: Secondary | ICD-10-CM | POA: Insufficient documentation

## 2017-02-27 DIAGNOSIS — F329 Major depressive disorder, single episode, unspecified: Secondary | ICD-10-CM | POA: Insufficient documentation

## 2017-02-27 DIAGNOSIS — D649 Anemia, unspecified: Secondary | ICD-10-CM | POA: Diagnosis not present

## 2017-02-27 DIAGNOSIS — G709 Myoneural disorder, unspecified: Secondary | ICD-10-CM | POA: Diagnosis not present

## 2017-02-27 HISTORY — PX: MASTECTOMY, PARTIAL: SHX709

## 2017-02-27 LAB — URINE DRUG SCREEN, QUALITATIVE (ARMC ONLY)
AMPHETAMINES, UR SCREEN: POSITIVE — AB
Barbiturates, Ur Screen: NOT DETECTED
Benzodiazepine, Ur Scrn: NOT DETECTED
Cannabinoid 50 Ng, Ur ~~LOC~~: POSITIVE — AB
Cocaine Metabolite,Ur ~~LOC~~: NOT DETECTED
MDMA (ECSTASY) UR SCREEN: NOT DETECTED
METHADONE SCREEN, URINE: NOT DETECTED
Opiate, Ur Screen: NOT DETECTED
Phencyclidine (PCP) Ur S: NOT DETECTED
TRICYCLIC, UR SCREEN: NOT DETECTED

## 2017-02-27 SURGERY — MASTECTOMY PARTIAL
Anesthesia: General | Laterality: Left | Wound class: Clean

## 2017-02-27 MED ORDER — EPHEDRINE SULFATE 50 MG/ML IJ SOLN
INTRAMUSCULAR | Status: DC | PRN
  Administered 2017-02-27 (×3): 10 mg via INTRAVENOUS

## 2017-02-27 MED ORDER — FAMOTIDINE 20 MG PO TABS
ORAL_TABLET | ORAL | Status: AC
Start: 2017-02-27 — End: 2017-02-27
  Administered 2017-02-27: 20 mg via ORAL
  Filled 2017-02-27: qty 1

## 2017-02-27 MED ORDER — BUPIVACAINE-EPINEPHRINE 0.5% -1:200000 IJ SOLN
INTRAMUSCULAR | Status: DC | PRN
  Administered 2017-02-27: 14 mL

## 2017-02-27 MED ORDER — DEXAMETHASONE SODIUM PHOSPHATE 10 MG/ML IJ SOLN
INTRAMUSCULAR | Status: AC
  Filled 2017-02-27: qty 1

## 2017-02-27 MED ORDER — MIDAZOLAM HCL 2 MG/2ML IJ SOLN
INTRAMUSCULAR | Status: AC
  Filled 2017-02-27: qty 2

## 2017-02-27 MED ORDER — ONDANSETRON HCL 4 MG/2ML IJ SOLN
INTRAMUSCULAR | Status: DC | PRN
  Administered 2017-02-27: 4 mg via INTRAVENOUS

## 2017-02-27 MED ORDER — LIDOCAINE HCL (CARDIAC) 20 MG/ML IV SOLN
INTRAVENOUS | Status: DC | PRN
  Administered 2017-02-27: 60 mg via INTRAVENOUS

## 2017-02-27 MED ORDER — FAMOTIDINE 20 MG PO TABS
20.0000 mg | ORAL_TABLET | Freq: Once | ORAL | Status: AC
  Administered 2017-02-27: 20 mg via ORAL

## 2017-02-27 MED ORDER — MIDAZOLAM HCL 2 MG/2ML IJ SOLN
INTRAMUSCULAR | Status: DC | PRN
  Administered 2017-02-27: 2 mg via INTRAVENOUS

## 2017-02-27 MED ORDER — LACTATED RINGERS IV SOLN
INTRAVENOUS | Status: DC
  Administered 2017-02-27: 07:00:00 via INTRAVENOUS

## 2017-02-27 MED ORDER — SUCCINYLCHOLINE CHLORIDE 20 MG/ML IJ SOLN
INTRAMUSCULAR | Status: AC
  Filled 2017-02-27: qty 1

## 2017-02-27 MED ORDER — ONDANSETRON HCL 4 MG/2ML IJ SOLN: 4.0000 mg | Freq: Once | INTRAMUSCULAR | Status: DC | PRN

## 2017-02-27 MED ORDER — FENTANYL CITRATE (PF) 100 MCG/2ML IJ SOLN: 25.0000 ug | INTRAMUSCULAR | Status: DC | PRN

## 2017-02-27 MED ORDER — FENTANYL CITRATE (PF) 100 MCG/2ML IJ SOLN
INTRAMUSCULAR | Status: AC
  Filled 2017-02-27: qty 2

## 2017-02-27 MED ORDER — ONDANSETRON HCL 4 MG/2ML IJ SOLN
INTRAMUSCULAR | Status: AC
  Filled 2017-02-27: qty 2

## 2017-02-27 MED ORDER — LIDOCAINE HCL (PF) 2 % IJ SOLN
INTRAMUSCULAR | Status: AC
  Filled 2017-02-27: qty 2

## 2017-02-27 MED ORDER — EPHEDRINE SULFATE 50 MG/ML IJ SOLN
INTRAMUSCULAR | Status: AC
  Filled 2017-02-27: qty 1

## 2017-02-27 MED ORDER — FENTANYL CITRATE (PF) 100 MCG/2ML IJ SOLN
INTRAMUSCULAR | Status: DC | PRN
  Administered 2017-02-27: 25 ug via INTRAVENOUS
  Administered 2017-02-27: 50 ug via INTRAVENOUS
  Administered 2017-02-27: 25 ug via INTRAVENOUS

## 2017-02-27 MED ORDER — ACETAMINOPHEN 10 MG/ML IV SOLN
INTRAVENOUS | Status: DC | PRN
  Administered 2017-02-27: 1000 mg via INTRAVENOUS

## 2017-02-27 MED ORDER — PROPOFOL 10 MG/ML IV BOLUS
INTRAVENOUS | Status: DC | PRN
  Administered 2017-02-27: 120 mg via INTRAVENOUS
  Administered 2017-02-27: 20 mg via INTRAVENOUS

## 2017-02-27 MED ORDER — DEXAMETHASONE SODIUM PHOSPHATE 10 MG/ML IJ SOLN
INTRAMUSCULAR | Status: DC | PRN
  Administered 2017-02-27: 5 mg via INTRAVENOUS

## 2017-02-27 MED ORDER — PHENYLEPHRINE HCL 10 MG/ML IJ SOLN
INTRAMUSCULAR | Status: AC
  Filled 2017-02-27: qty 1

## 2017-02-27 MED ORDER — PHENYLEPHRINE HCL 10 MG/ML IJ SOLN
INTRAMUSCULAR | Status: DC | PRN
  Administered 2017-02-27: 100 ug via INTRAVENOUS

## 2017-02-27 MED ORDER — HYDROCODONE-ACETAMINOPHEN 5-325 MG PO TABS
1.0000 | ORAL_TABLET | ORAL | Status: DC | PRN
  Administered 2017-02-27: 1 via ORAL

## 2017-02-27 MED ORDER — BUPIVACAINE-EPINEPHRINE (PF) 0.5% -1:200000 IJ SOLN
INTRAMUSCULAR | Status: AC
  Filled 2017-02-27: qty 30

## 2017-02-27 MED ORDER — ACETAMINOPHEN 10 MG/ML IV SOLN
INTRAVENOUS | Status: AC
  Filled 2017-02-27: qty 100

## 2017-02-27 MED ORDER — HYDROCODONE-ACETAMINOPHEN 5-325 MG PO TABS
ORAL_TABLET | ORAL | Status: AC
  Filled 2017-02-27: qty 1

## 2017-02-27 MED ORDER — PROPOFOL 10 MG/ML IV BOLUS
INTRAVENOUS | Status: AC
Start: 2017-02-27 — End: 2017-02-27
  Filled 2017-02-27: qty 40

## 2017-02-27 MED ORDER — GLYCOPYRROLATE 0.2 MG/ML IJ SOLN
INTRAMUSCULAR | Status: AC
  Filled 2017-02-27: qty 1

## 2017-02-27 SURGICAL SUPPLY — 28 items
BLADE SURG 15 STRL LF DISP TIS (BLADE) ×1 IMPLANT
BLADE SURG 15 STRL SS (BLADE) ×1
CANISTER SUCT 1200ML W/VALVE (MISCELLANEOUS) ×2 IMPLANT
CHLORAPREP W/TINT 26ML (MISCELLANEOUS) ×2 IMPLANT
CNTNR SPEC 2.5X3XGRAD LEK (MISCELLANEOUS) ×1
CONT SPEC 4OZ STER OR WHT (MISCELLANEOUS) ×1
CONTAINER SPEC 2.5X3XGRAD LEK (MISCELLANEOUS) ×1 IMPLANT
DERMABOND ADVANCED (GAUZE/BANDAGES/DRESSINGS) ×1
DERMABOND ADVANCED .7 DNX12 (GAUZE/BANDAGES/DRESSINGS) ×1 IMPLANT
DRAPE LAPAROTOMY 77X122 PED (DRAPES) ×2 IMPLANT
ELECT REM PT RETURN 9FT ADLT (ELECTROSURGICAL) ×2
ELECTRODE REM PT RTRN 9FT ADLT (ELECTROSURGICAL) ×1 IMPLANT
GLOVE BIO SURGEON STRL SZ7.5 (GLOVE) ×10 IMPLANT
GOWN STRL REUS W/ TWL LRG LVL3 (GOWN DISPOSABLE) ×2 IMPLANT
GOWN STRL REUS W/TWL LRG LVL3 (GOWN DISPOSABLE) ×2
KIT RM TURNOVER STRD PROC AR (KITS) ×2 IMPLANT
LABEL OR SOLS (LABEL) ×2 IMPLANT
MARGIN MAP 10MM (MISCELLANEOUS) ×2 IMPLANT
NEEDLE HYPO 25X1 1.5 SAFETY (NEEDLE) ×2 IMPLANT
PACK BASIN MINOR ARMC (MISCELLANEOUS) ×2 IMPLANT
SUT CHROMIC 4 0 RB 1X27 (SUTURE) ×2 IMPLANT
SUT ETHILON 3-0 FS-10 30 BLK (SUTURE) ×2
SUT MNCRL 4-0 (SUTURE) ×1
SUT MNCRL 4-0 27XMFL (SUTURE) ×1
SUTURE EHLN 3-0 FS-10 30 BLK (SUTURE) ×1 IMPLANT
SUTURE MNCRL 4-0 27XMF (SUTURE) ×1 IMPLANT
SYRINGE 10CC LL (SYRINGE) ×2 IMPLANT
WATER STERILE IRR 1000ML POUR (IV SOLUTION) ×2 IMPLANT

## 2017-02-27 NOTE — Op Note (Signed)
OPERATIVE REPORT  PREOPERATIVE  DIAGNOSIS: . Ductal carcinoma in situ of left breast  POSTOPERATIVE DIAGNOSIS: . Ductal carcinoma in situ of left breast  PROCEDURE: . Left partial mastectomy  ANESTHESIA:  General  SURGEON: Rochel Brome  MD   INDICATIONS: . She had recent left partial mastectomy due to ductal carcinoma in situ of left breast. Final pathology demonstrated involvement of the medial and lateral margins. Repeat left partial mastectomy was recommended for further treatment.  With the patient on the operating table in the supine position she was placed under general anesthesia. The left arm was placed on a lateral arm support. The left breast and surrounding chest wall were prepared with ChloraPrep and draped in a sterile manner. A curvilinear incision was made from the 12:00 position to the 3:00 position on both sides of the old scar removing an ellipse of skin which included the scar. Dissection was carried down through subcutaneous tissues. There was some firmness at the previous partial mastectomy site which helped to guide the dissection. A mass of tissue was excised extending down to the deep fascia. There was a small opening and a seroma which was adjacent to the muscle. As the specimen was being resected the margin maps were used suturing markers to the cranial caudal medial lateral and deep margins. A stitch was placed at the 12:00 position of the skin ellipse. The specimen was submitted fresh for pathology to check for margins. The wound was inspected and a number of small bleeding points were cauterized. The subcuticular tissues were infiltrated with half percent Sensorcaine with epinephrine. Also deeper tissues surrounding cautery artifact were infiltrated as well. Subcutaneous tissues were approximated with interrupted 4-0 chromic. The skin was closed with running 4-0 Monocryl subcuticular suture. The pathologist called to say that the margins appeared to be satisfactory.  Dermabond was applied to the wound and allowed to dry. The patient appeared to be in satisfactory condition and was prepared for transfer to the recovery room  [place].D.

## 2017-02-27 NOTE — H&P (Signed)
  She comes in today for repeat left partial mastectomy due to recent findings of DCIS with recent excision with involvement of medial and lateral margins.  She reports no change in condition.  Discussed plan for surgery.

## 2017-02-27 NOTE — Anesthesia Postprocedure Evaluation (Signed)
Anesthesia Post Note  Patient: Angela Murphy  Procedure(s) Performed: Procedure(s) (LRB): MASTECTOMY PARTIAL (Left)  Patient location during evaluation: PACU Anesthesia Type: General Level of consciousness: awake and alert Pain management: pain level controlled Vital Signs Assessment: post-procedure vital signs reviewed and stable Respiratory status: spontaneous breathing, nonlabored ventilation, respiratory function stable and patient connected to nasal cannula oxygen Cardiovascular status: blood pressure returned to baseline and stable Postop Assessment: no signs of nausea or vomiting Anesthetic complications: no     Last Vitals:  Vitals:   02/27/17 0952 02/27/17 1122  BP: (!) 147/62 140/64  Pulse: 72 73  Resp: 16 16  Temp: 36.6 C     Last Pain:  Vitals:   02/27/17 1122  TempSrc:   PainSc: 0-No pain                 Martha Clan

## 2017-02-27 NOTE — Discharge Instructions (Addendum)
Take Tylenol or Norco if needed for pain. ° °Should not drive or do anything dangerous when taking Norco. ° °May shower and blot dry. ° °Wear bra as desired for comfort and support. ° °AMBULATORY SURGERY  °DISCHARGE INSTRUCTIONS ° ° °1) The drugs that you were given will stay in your system until tomorrow so for the next 24 hours you should not: ° °A) Drive an automobile °B) Make any legal decisions °C) Drink any alcoholic beverage ° ° °2) You may resume regular meals tomorrow.  Today it is better to start with liquids and gradually work up to solid foods. ° °You may eat anything you prefer, but it is better to start with liquids, then soup and crackers, and gradually work up to solid foods. ° ° °3) Please notify your doctor immediately if you have any unusual bleeding, trouble breathing, redness and pain at the surgery site, drainage, fever, or pain not relieved by medication. ° ° ° °4) Additional Instructions: ° ° ° ° ° ° ° °Please contact your physician with any problems or Same Day Surgery at [phone], Monday through Friday 6 am to 4 pm, or [place] [facility] [facility] number at [phone]. °

## 2017-02-27 NOTE — Anesthesia Post-op Follow-up Note (Cosign Needed)
Anesthesia QCDR form completed.        

## 2017-02-27 NOTE — Anesthesia Preprocedure Evaluation (Signed)
Anesthesia Evaluation  Patient identified by MRN, date of birth, ID band Patient awake    Reviewed: Allergy & Precautions, NPO status , Patient's Chart, lab work & pertinent test results  History of Anesthesia Complications Negative for: history of anesthetic complications  Airway Mallampati: III  TM Distance: <3 FB Neck ROM: limited    Dental   Permanent bridge x2:   Pulmonary neg shortness of breath, sleep apnea and Continuous Positive Airway Pressure Ventilation , neg COPD, neg recent URI,           Cardiovascular Exercise Tolerance: Good hypertension, Pt. on medications (-) angina(-) CAD, (-) Past MI, (-) Cardiac Stents and (-) CABG (-) dysrhythmias (-) Valvular Problems/Murmurs     Neuro/Psych neg Seizures PSYCHIATRIC DISORDERS ADHD Neuromuscular disease    GI/Hepatic negative GI ROS, Neg liver ROS,   Endo/Other  neg diabetesHypothyroidism   Renal/GU negative Renal ROS  negative genitourinary   Musculoskeletal  (+) Arthritis , Osteoarthritis,    Abdominal Normal abdominal exam  (+)   Peds negative pediatric ROS (+)  Hematology  (+) anemia ,   Anesthesia Other Findings Past Medical History: No date: ADHD No date: Allergy No date: Anemia No date: Anxiety No date: Arthritis 12/2016: Cancer ([place])     Comment: LEFT breast, DCIS No date: Cataract No date: Chicken pox No date: Cognitive decline No date: Depression No date: Hypertension No date: Hypertriglyceridemia No date: Neuromuscular disorder ([facility]) No date: Neuropathy No date: Post traumatic stress disorder No date: Sleep apnea No date: Thyroid disease  Reproductive/Obstetrics negative OB ROS                             Anesthesia Physical  Anesthesia Plan  ASA: II  Anesthesia Plan: General   Post-op Pain Management:    Induction: Intravenous  Airway Management Planned: LMA  Additional Equipment:   Intra-op  Plan:   Post-operative Plan: Extubation in OR  Informed Consent: I have reviewed the patients History and Physical, chart, labs and discussed the procedure including the risks, benefits and alternatives for the proposed anesthesia with the patient or authorized representative who has indicated his/her understanding and acceptance.     Plan Discussed with: CRNA and Surgeon  Anesthesia Plan Comments:         Anesthesia Quick Evaluation

## 2017-02-27 NOTE — Transfer of Care (Signed)
Immediate Anesthesia Transfer of Care Note  Patient: Angela Murphy  Procedure(s) Performed: Procedure(s): MASTECTOMY PARTIAL (Left)  Patient Location: PACU  Anesthesia Type:General  Level of Consciousness: sedated  Airway & Oxygen Therapy: Patient Spontanous Breathing and Patient connected to face mask oxygen  Post-op Assessment: Report given to RN and Post -op Vital signs reviewed and stable  Post vital signs: Reviewed and stable  Last Vitals:  Vitals:   02/27/17 0900 02/27/17 0915  BP: (!) 143/68 (!) 142/71  Pulse: 83 81  Resp: 16 19  Temp: 67.2 C     Complications: No apparent anesthesia complications

## 2017-02-27 NOTE — Anesthesia Procedure Notes (Signed)
Procedure Name: LMA Insertion Date/Time: 02/27/2017 7:42 AM Performed by: Doreen Salvage Pre-anesthesia Checklist: Patient identified, Patient being monitored, Timeout performed, Emergency Drugs available and Suction available Patient Re-evaluated:Patient Re-evaluated prior to inductionOxygen Delivery Method: Circle system utilized Preoxygenation: Pre-oxygenation with 100% oxygen Intubation Type: IV induction Ventilation: Mask ventilation without difficulty LMA: LMA inserted LMA Size: 3.5 Tube type: Oral Number of attempts: 1 Placement Confirmation: positive ETCO2 and breath sounds checked- equal and bilateral Tube secured with: Tape Dental Injury: Teeth and Oropharynx as per pre-operative assessment

## 2017-02-28 LAB — SURGICAL PATHOLOGY

## 2017-03-04 DIAGNOSIS — M5137 Other intervertebral disc degeneration, lumbosacral region: Secondary | ICD-10-CM | POA: Diagnosis not present

## 2017-03-04 DIAGNOSIS — M791 Myalgia: Secondary | ICD-10-CM | POA: Diagnosis not present

## 2017-03-04 DIAGNOSIS — M6240 Contracture of muscle, unspecified site: Secondary | ICD-10-CM | POA: Diagnosis not present

## 2017-03-04 DIAGNOSIS — M545 Low back pain: Secondary | ICD-10-CM | POA: Diagnosis not present

## 2017-03-04 DIAGNOSIS — M9903 Segmental and somatic dysfunction of lumbar region: Secondary | ICD-10-CM | POA: Diagnosis not present

## 2017-03-04 DIAGNOSIS — R293 Abnormal posture: Secondary | ICD-10-CM | POA: Diagnosis not present

## 2017-03-06 ENCOUNTER — Encounter: Payer: Self-pay | Admitting: *Deleted

## 2017-03-06 ENCOUNTER — Ambulatory Visit
Admission: RE | Admit: 2017-03-06 | Discharge: 2017-03-06 | Disposition: A | Discharge status: Home / Self Care | Payer: Medicare Other | Source: Ambulatory Visit | Attending: Radiation Oncology | Admitting: Radiation Oncology

## 2017-03-06 ENCOUNTER — Encounter: Payer: Self-pay | Admitting: Radiation Oncology

## 2017-03-06 ENCOUNTER — Inpatient Hospital Stay: Payer: Medicare Other | Admitting: Oncology

## 2017-03-06 VITALS — BP 157/88 | HR 58 | Temp 95.6°F | Resp 20 | Wt 119.6 lb

## 2017-03-06 DIAGNOSIS — D0512 Intraductal carcinoma in situ of left breast: Secondary | ICD-10-CM | POA: Insufficient documentation

## 2017-03-06 DIAGNOSIS — F909 Attention-deficit hyperactivity disorder, unspecified type: Secondary | ICD-10-CM | POA: Insufficient documentation

## 2017-03-06 DIAGNOSIS — M6240 Contracture of muscle, unspecified site: Secondary | ICD-10-CM | POA: Diagnosis not present

## 2017-03-06 DIAGNOSIS — E781 Pure hyperglyceridemia: Secondary | ICD-10-CM | POA: Insufficient documentation

## 2017-03-06 DIAGNOSIS — F4310 Post-traumatic stress disorder, unspecified: Secondary | ICD-10-CM | POA: Insufficient documentation

## 2017-03-06 DIAGNOSIS — M545 Low back pain: Secondary | ICD-10-CM | POA: Diagnosis not present

## 2017-03-06 DIAGNOSIS — M129 Arthropathy, unspecified: Secondary | ICD-10-CM | POA: Insufficient documentation

## 2017-03-06 DIAGNOSIS — M5137 Other intervertebral disc degeneration, lumbosacral region: Secondary | ICD-10-CM | POA: Diagnosis not present

## 2017-03-06 DIAGNOSIS — Z510 Encounter for antineoplastic radiation therapy: Secondary | ICD-10-CM | POA: Insufficient documentation

## 2017-03-06 DIAGNOSIS — Z7982 Long term (current) use of aspirin: Secondary | ICD-10-CM | POA: Insufficient documentation

## 2017-03-06 DIAGNOSIS — I10 Essential (primary) hypertension: Secondary | ICD-10-CM | POA: Insufficient documentation

## 2017-03-06 DIAGNOSIS — M791 Myalgia: Secondary | ICD-10-CM | POA: Diagnosis not present

## 2017-03-06 DIAGNOSIS — G4730 Sleep apnea, unspecified: Secondary | ICD-10-CM | POA: Insufficient documentation

## 2017-03-06 DIAGNOSIS — Z79899 Other long term (current) drug therapy: Secondary | ICD-10-CM | POA: Insufficient documentation

## 2017-03-06 DIAGNOSIS — E039 Hypothyroidism, unspecified: Secondary | ICD-10-CM | POA: Insufficient documentation

## 2017-03-06 DIAGNOSIS — D649 Anemia, unspecified: Secondary | ICD-10-CM | POA: Insufficient documentation

## 2017-03-06 DIAGNOSIS — M9903 Segmental and somatic dysfunction of lumbar region: Secondary | ICD-10-CM | POA: Diagnosis not present

## 2017-03-06 DIAGNOSIS — R293 Abnormal posture: Secondary | ICD-10-CM | POA: Diagnosis not present

## 2017-03-06 DIAGNOSIS — F419 Anxiety disorder, unspecified: Secondary | ICD-10-CM | POA: Insufficient documentation

## 2017-03-06 DIAGNOSIS — F329 Major depressive disorder, single episode, unspecified: Secondary | ICD-10-CM | POA: Insufficient documentation
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
## 2017-03-06 NOTE — Consult Note (Signed)
NEW PATIENT EVALUATION  Name: Angela Murphy  MRN: [id number]  Date:   03/06/2017     DOB: 06-Jan-1946   This 71 y.o. female patient presents to the clinic for initial evaluation of stage 0 (Tis N0 M0) ductal carcinoma in situ of the left breast status post wide local excision.  REFERRING PHYSICIAN: Harrison Mons, PA-C  CHIEF COMPLAINT:  Chief Complaint  Patient presents with  . Breast Cancer    Pt is here for initial consultation of breast cancer    DIAGNOSIS: The encounter diagnosis was Ductal carcinoma in situ (DCIS) of left breast.   PREVIOUS INVESTIGATIONS:  Mammograms ultrasound reviewed Pathology reports reviewed Clinical notes reviewed  HPI: patient is a 71 year old female who presented with abnormal screening mammogramsof her left breast. There was at least a 4.4 x 3.2 cm area fine pleomorphic calcifications in the upper outer quadrant of the left breast. She underwent stereotactic guided biopsy which was positive for ductal carcinoma in situ.she then underwent a wide local excision showing grade 3 ductal carcinoma in situ with comedonecrosis. One sentinel lymph node was negative. Medial and lateral margins resection were involved. Tumor measured at least 2 cm. She underwent reexcision showing again residual high-grade ductal carcinoma in situ with reexcision is margins negative. Closest medial margin was 2 mm. She has done well postoperatively is still somewhat sore. Her ER/PR status is not yet available and is being reviewed by medical oncology.  PLANNED TREATMENT REGIMEN: hypofractionated left whole breast radiation  PAST MEDICAL HISTORY:  has a past medical history of ADHD; Allergy; Anemia; Anxiety; Arthritis; Cancer ([place]) (12/2016); Cataract; Chicken pox; Cognitive decline; Depression; Hypertension; Hypertriglyceridemia; Hypothyroidism; Neuromuscular disorder ([place]); Neuropathy; Post traumatic stress disorder; Sleep apnea; and Thyroid disease.    PAST SURGICAL HISTORY:   Past Surgical History:  Procedure Laterality Date  . BREAST BIOPSY Left 12/25/2016   DCIS   . BREAST EXCISIONAL BIOPSY Left 02/07/2017   lumpectomy DCIS   . CATARACT EXTRACTION W/ INTRAOCULAR LENS  IMPLANT, BILATERAL    . EYE SURGERY Bilateral   . MASTECTOMY, PARTIAL Left 02/27/2017   Procedure: MASTECTOMY PARTIAL;  Surgeon: Leonie Green, MD;  Location: [facility] ORS;  Service: General;  Laterality: Left;  . PARTIAL MASTECTOMY WITH NEEDLE LOCALIZATION Left 02/07/2017   Procedure: PARTIAL MASTECTOMY WITH NEEDLE LOCALIZATION;  Surgeon: Leonie Green, MD;  Location: [facility] ORS;  Service: General;  Laterality: Left;  . SENTINEL NODE BIOPSY Left 02/07/2017   Procedure: SENTINEL NODE BIOPSY;  Surgeon: Leonie Green, MD;  Location: [facility] ORS;  Service: General;  Laterality: Left;  . THYROIDECTOMY, PARTIAL  1974  . TONSILLECTOMY      FAMILY HISTORY: family history includes Alcohol abuse in her maternal grandfather; Arthritis in her maternal grandmother and mother; Heart disease in her father, maternal grandfather, maternal grandmother, mother, paternal grandfather, and paternal grandmother; Mental illness in her maternal uncle.  SOCIAL HISTORY:  reports that she has never smoked. She has never used smokeless tobacco. She reports that she drinks alcohol. She reports that she uses drugs, including Marijuana.  ALLERGIES: Other  MEDICATIONS:  Current Outpatient Prescriptions  Medication Sig Dispense Refill  . amphetamine-dextroamphetamine (ADDERALL) 20 MG tablet Take 5 mg by mouth daily. 0.25 tablet    . aspirin 81 MG chewable tablet Chew 1 tablet (81 mg total) by mouth daily. 30 tablet 0  . busPIRone (BUSPAR) 30 MG tablet TAKE ONE TABLET BY MOUTH TWICE DAILY 180 tablet 2  . Cholecalciferol (VITAMIN D3) 5000 units TABS Take 10,000  Units by mouth daily with breakfast.     . escitalopram (LEXAPRO) 10 MG tablet Take 1 tablet (10 mg total) by mouth daily. (Patient taking differently: Take  10 mg by mouth every morning. ) 90 tablet 1  . ezetimibe (ZETIA) 10 MG tablet Take 1 tablet (10 mg total) by mouth at bedtime. 90 tablet 0  . fexofenadine (ALLEGRA) 180 MG tablet Take 180 mg by mouth every morning.    Marland Kitchen glucosamine-chondroitin 500-400 MG tablet Take 1 tablet by mouth 2 (two) times daily.    Marland Kitchen HYDROcodone-acetaminophen (NORCO) 5-325 MG tablet Take 1-2 tablets by mouth every 4 (four) hours as needed for moderate pain. 12 tablet 0  . ibuprofen (ADVIL,MOTRIN) 200 MG tablet Take 200-400 mg by mouth every 8 (eight) hours as needed (for pain.).    Marland Kitchen lisinopril (PRINIVIL,ZESTRIL) 20 MG tablet TAKE ONE TABLET BY MOUTH ONCE DAILY (Patient taking differently: TAKE ONE TABLET BY MOUTH AT BEDTIME) 90 tablet 1  . NON FORMULARY Take 2,000 mg by mouth daily with breakfast. MUCUNA PRURIENS 500 MG CAPSULES    . Omega-3 Fatty Acids (FISH OIL) 500 MG CAPS Take 500 mg by mouth every evening.    Marland Kitchen OVER THE COUNTER MEDICATION Take 100 mg by mouth daily with breakfast. BACOPA FOR COGNITIVE & ENERGY FUNCTIONS    . Probiotic Product (PROBIOTIC PO) Take 2 capsules by mouth daily. MEGASPORE PROBIOTICS    . Sodium Fluoride (PREVIDENT 5000 DRY MOUTH DT) Place 1 application onto teeth daily.    Marland Kitchen thyroid (ARMOUR) 90 MG tablet Take 1 tablet (90 mg total) by mouth daily before breakfast. 90 tablet 1  . traZODone (DESYREL) 50 MG tablet Take 50 mg by mouth at bedtime as needed for sleep.     Marland Kitchen zinc gluconate 50 MG tablet Take 50 mg by mouth at bedtime.      No current facility-administered medications for this encounter.     ECOG PERFORMANCE STATUS:  1 - Symptomatic but completely ambulatory  REVIEW OF SYSTEMS:  Patient denies any weight loss, fatigue, weakness, fever, chills or night sweats. Patient denies any loss of vision, blurred vision. Patient denies any ringing  of the ears or hearing loss. No irregular heartbeat. Patient denies heart murmur or history of fainting. Patient denies any chest pain or pain  radiating to her upper extremities. Patient denies any shortness of breath, difficulty breathing at night, cough or hemoptysis. Patient denies any swelling in the lower legs. Patient denies any nausea vomiting, vomiting of blood, or coffee ground material in the vomitus. Patient denies any stomach pain. Patient states has had normal bowel movements no significant constipation or diarrhea. Patient denies any dysuria, hematuria or significant nocturia. Patient denies any problems walking, swelling in the joints or loss of balance. Patient denies any skin changes, loss of hair or loss of weight. Patient denies any excessive worrying or anxiety or significant depression. Patient denies any problems with insomnia. Patient denies excessive thirst, polyuria, polydipsia. Patient denies any swollen glands, patient denies easy bruising or easy bleeding. Patient denies any recent infections, allergies or URI. Patient "s visual fields have not changed significantly in recent time.    PHYSICAL EXAM: BP (!) 157/88   Pulse (!) 58   Temp (!) 95.6 F (35.3 C)   Resp 20   Wt 119 lb 9.6 oz (54.2 kg)   BMI 21.88 kg/m  She is status post wide local excision of the left breast incision is healing well although somewhat slow. No dominant  mass or nodularity is noted in either breast in 2 positions examined. No axillary or supraclavicular adenopathy is appreciated. Well-developed well-nourished patient in NAD. HEENT reveals PERLA, EOMI, discs not visualized.  Oral cavity is clear. No oral mucosal lesions are identified. Neck is clear without evidence of cervical or supraclavicular adenopathy. Lungs are clear to A&P. Cardiac examination is essentially unremarkable with regular rate and rhythm without murmur rub or thrill. Abdomen is benign with no organomegaly or masses noted. Motor sensory and DTR levels are equal and symmetric in the upper and lower extremities. Cranial nerves II through XII are grossly intact. Proprioception  is intact. No peripheral adenopathy or edema is identified. No motor or sensory levels are noted. Crude visual fields are within normal range.  LABORATORY DATA: pathology reports reviewed    RADIOLOGY RESULTS:mammogram and ultrasound reviewed   IMPRESSION: ductal carcinoma in situ of the left breast status post wide local excision in 71 year old female  PLAN: at this time based on her breast size I believe she would be a good candidate for hypofractionated radiation therapy over 4 weeks. Based on the 2 mm margin I would boost her scar another 1600 cGy using electron beam. Risks and benefits of treatment including skin reaction fatigue alteration of blood counts possible inclusion of superficial lung all were discussed in detail with the patient. I have personally set up and ordered CT simulation on her for next week. Patient will also be seeing medical oncology once ER/PR status is established to discuss possible tamoxifen therapy. Patient seems to comprehend my treatment plan well.  I would like to take this opportunity to thank you for allowing me to participate in the care of your patient.Armstead Peaks., MD

## 2017-03-07 LAB — SURGICAL PATHOLOGY

## 2017-03-07 NOTE — Progress Notes (Signed)
  Oncology Nurse Navigator Documentation        Oncology Nurse Navigator Documentation  Navigator Location: [facility] (03/07/17 1000)   )Navigator Encounter Type: Initial RadOnc (03/07/17 1000)                                                    Time Spent with Patient: 15 (03/07/17 1000)   Met patient during her initial radiation oncology visit.  She was crocheting.  States it relaxed her.  No needs at this time.  She is to call if she has any questions or needs.

## 2017-03-10 ENCOUNTER — Ambulatory Visit: Payer: Medicare Other

## 2017-03-13 ENCOUNTER — Ambulatory Visit: Payer: Medicare Other

## 2017-03-13 DIAGNOSIS — M9903 Segmental and somatic dysfunction of lumbar region: Secondary | ICD-10-CM | POA: Diagnosis not present

## 2017-03-13 DIAGNOSIS — R293 Abnormal posture: Secondary | ICD-10-CM | POA: Diagnosis not present

## 2017-03-13 DIAGNOSIS — M6240 Contracture of muscle, unspecified site: Secondary | ICD-10-CM | POA: Diagnosis not present

## 2017-03-13 DIAGNOSIS — M545 Low back pain: Secondary | ICD-10-CM | POA: Diagnosis not present

## 2017-03-13 DIAGNOSIS — M791 Myalgia: Secondary | ICD-10-CM | POA: Diagnosis not present

## 2017-03-13 DIAGNOSIS — M5137 Other intervertebral disc degeneration, lumbosacral region: Secondary | ICD-10-CM | POA: Diagnosis not present

## 2017-03-14 ENCOUNTER — Ambulatory Visit (INDEPENDENT_AMBULATORY_CARE_PROVIDER_SITE_OTHER): Payer: Medicare Other | Admitting: Psychology

## 2017-03-14 DIAGNOSIS — F4310 Post-traumatic stress disorder, unspecified: Secondary | ICD-10-CM

## 2017-03-17 ENCOUNTER — Ambulatory Visit
Admission: RE | Admit: 2017-03-17 | Discharge: 2017-03-17 | Disposition: A | Discharge status: Home / Self Care | Payer: Medicare Other | Source: Ambulatory Visit | Attending: Radiation Oncology | Admitting: Radiation Oncology

## 2017-03-17 ENCOUNTER — Encounter: Payer: Self-pay | Admitting: Physician Assistant

## 2017-03-17 DIAGNOSIS — D0512 Intraductal carcinoma in situ of left breast: Secondary | ICD-10-CM | POA: Diagnosis not present

## 2017-03-17 DIAGNOSIS — Z510 Encounter for antineoplastic radiation therapy: Secondary | ICD-10-CM | POA: Diagnosis not present

## 2017-03-18 DIAGNOSIS — R293 Abnormal posture: Secondary | ICD-10-CM | POA: Diagnosis not present

## 2017-03-18 DIAGNOSIS — M545 Low back pain: Secondary | ICD-10-CM | POA: Diagnosis not present

## 2017-03-18 DIAGNOSIS — M791 Myalgia: Secondary | ICD-10-CM | POA: Diagnosis not present

## 2017-03-18 DIAGNOSIS — M6240 Contracture of muscle, unspecified site: Secondary | ICD-10-CM | POA: Diagnosis not present

## 2017-03-18 DIAGNOSIS — M9903 Segmental and somatic dysfunction of lumbar region: Secondary | ICD-10-CM | POA: Diagnosis not present

## 2017-03-18 DIAGNOSIS — M5137 Other intervertebral disc degeneration, lumbosacral region: Secondary | ICD-10-CM | POA: Diagnosis not present

## 2017-03-19 DIAGNOSIS — M6240 Contracture of muscle, unspecified site: Secondary | ICD-10-CM | POA: Diagnosis not present

## 2017-03-19 DIAGNOSIS — R293 Abnormal posture: Secondary | ICD-10-CM | POA: Diagnosis not present

## 2017-03-19 DIAGNOSIS — M5137 Other intervertebral disc degeneration, lumbosacral region: Secondary | ICD-10-CM | POA: Diagnosis not present

## 2017-03-19 DIAGNOSIS — M9903 Segmental and somatic dysfunction of lumbar region: Secondary | ICD-10-CM | POA: Diagnosis not present

## 2017-03-19 MED ORDER — BUSPIRONE HCL 30 MG PO TABS: 30.0000 mg | ORAL_TABLET | Freq: Two times a day (BID) | ORAL | 1 refills | Status: DC

## 2017-03-19 MED ORDER — EZETIMIBE 10 MG PO TABS: 10.0000 mg | ORAL_TABLET | Freq: Every day | ORAL | 1 refills | Status: DC

## 2017-03-19 MED ORDER — ESCITALOPRAM OXALATE 10 MG PO TABS: 10.0000 mg | ORAL_TABLET | Freq: Every day | ORAL | 1 refills | Status: DC

## 2017-03-19 MED ORDER — LISINOPRIL 20 MG PO TABS: 20.0000 mg | ORAL_TABLET | Freq: Every day | ORAL | 1 refills | Status: DC

## 2017-03-19 MED ORDER — THYROID 90 MG PO TABS: 90.0000 mg | ORAL_TABLET | Freq: Every day | ORAL | 1 refills | Status: DC

## 2017-03-21 ENCOUNTER — Other Ambulatory Visit: Payer: Self-pay | Admitting: *Deleted

## 2017-03-21 ENCOUNTER — Ambulatory Visit: Payer: Medicare Other | Admitting: Psychology

## 2017-03-21 DIAGNOSIS — Z510 Encounter for antineoplastic radiation therapy: Secondary | ICD-10-CM | POA: Diagnosis not present

## 2017-03-21 DIAGNOSIS — D0512 Intraductal carcinoma in situ of left breast: Secondary | ICD-10-CM | POA: Diagnosis not present

## 2017-03-25 ENCOUNTER — Ambulatory Visit
Admission: RE | Admit: 2017-03-25 | Discharge: 2017-03-25 | Disposition: A | Discharge status: Home / Self Care | Payer: Medicare Other | Source: Ambulatory Visit | Attending: Radiation Oncology | Admitting: Radiation Oncology

## 2017-03-25 DIAGNOSIS — R293 Abnormal posture: Secondary | ICD-10-CM | POA: Diagnosis not present

## 2017-03-25 DIAGNOSIS — M9903 Segmental and somatic dysfunction of lumbar region: Secondary | ICD-10-CM | POA: Diagnosis not present

## 2017-03-25 DIAGNOSIS — M6240 Contracture of muscle, unspecified site: Secondary | ICD-10-CM | POA: Diagnosis not present

## 2017-03-25 DIAGNOSIS — M5137 Other intervertebral disc degeneration, lumbosacral region: Secondary | ICD-10-CM | POA: Diagnosis not present

## 2017-03-25 DIAGNOSIS — Z510 Encounter for antineoplastic radiation therapy: Secondary | ICD-10-CM | POA: Diagnosis not present

## 2017-03-25 DIAGNOSIS — D0512 Intraductal carcinoma in situ of left breast: Secondary | ICD-10-CM | POA: Diagnosis not present

## 2017-03-26 ENCOUNTER — Ambulatory Visit
Admission: RE | Admit: 2017-03-26 | Discharge: 2017-03-26 | Disposition: A | Discharge status: Home / Self Care | Payer: Medicare Other | Source: Ambulatory Visit | Attending: Radiation Oncology | Admitting: Radiation Oncology

## 2017-03-26 DIAGNOSIS — D0512 Intraductal carcinoma in situ of left breast: Secondary | ICD-10-CM | POA: Diagnosis not present

## 2017-03-26 DIAGNOSIS — Z510 Encounter for antineoplastic radiation therapy: Secondary | ICD-10-CM | POA: Diagnosis not present

## 2017-03-27 ENCOUNTER — Ambulatory Visit
Admission: RE | Admit: 2017-03-27 | Discharge: 2017-03-27 | Disposition: A | Discharge status: Home / Self Care | Payer: Medicare Other | Source: Ambulatory Visit | Attending: Radiation Oncology | Admitting: Radiation Oncology

## 2017-03-27 DIAGNOSIS — R293 Abnormal posture: Secondary | ICD-10-CM | POA: Diagnosis not present

## 2017-03-27 DIAGNOSIS — M6240 Contracture of muscle, unspecified site: Secondary | ICD-10-CM | POA: Diagnosis not present

## 2017-03-27 DIAGNOSIS — M5137 Other intervertebral disc degeneration, lumbosacral region: Secondary | ICD-10-CM | POA: Diagnosis not present

## 2017-03-27 DIAGNOSIS — Z510 Encounter for antineoplastic radiation therapy: Secondary | ICD-10-CM | POA: Diagnosis not present

## 2017-03-27 DIAGNOSIS — M9903 Segmental and somatic dysfunction of lumbar region: Secondary | ICD-10-CM | POA: Diagnosis not present

## 2017-03-27 DIAGNOSIS — D0512 Intraductal carcinoma in situ of left breast: Secondary | ICD-10-CM | POA: Diagnosis not present

## 2017-03-28 ENCOUNTER — Ambulatory Visit
Admission: RE | Admit: 2017-03-28 | Discharge: 2017-03-28 | Disposition: A | Discharge status: Home / Self Care | Payer: Medicare Other | Source: Ambulatory Visit | Attending: Radiation Oncology | Admitting: Radiation Oncology

## 2017-03-28 DIAGNOSIS — F411 Generalized anxiety disorder: Secondary | ICD-10-CM | POA: Diagnosis not present

## 2017-03-28 DIAGNOSIS — D0512 Intraductal carcinoma in situ of left breast: Secondary | ICD-10-CM | POA: Diagnosis not present

## 2017-03-28 DIAGNOSIS — F4310 Post-traumatic stress disorder, unspecified: Secondary | ICD-10-CM | POA: Diagnosis not present

## 2017-03-28 DIAGNOSIS — Z510 Encounter for antineoplastic radiation therapy: Secondary | ICD-10-CM | POA: Diagnosis not present

## 2017-03-28 DIAGNOSIS — F341 Dysthymic disorder: Secondary | ICD-10-CM | POA: Diagnosis not present

## 2017-03-31 ENCOUNTER — Ambulatory Visit
Admission: RE | Admit: 2017-03-31 | Discharge: 2017-03-31 | Disposition: A | Discharge status: Home / Self Care | Payer: Medicare Other | Source: Ambulatory Visit | Attending: Radiation Oncology | Admitting: Radiation Oncology

## 2017-03-31 DIAGNOSIS — Z510 Encounter for antineoplastic radiation therapy: Secondary | ICD-10-CM | POA: Diagnosis not present

## 2017-03-31 DIAGNOSIS — D0512 Intraductal carcinoma in situ of left breast: Secondary | ICD-10-CM | POA: Diagnosis not present

## 2017-04-01 ENCOUNTER — Ambulatory Visit
Admission: RE | Admit: 2017-04-01 | Discharge: 2017-04-01 | Disposition: A | Discharge status: Home / Self Care | Payer: Medicare Other | Source: Ambulatory Visit | Attending: Radiation Oncology | Admitting: Radiation Oncology

## 2017-04-01 DIAGNOSIS — M9903 Segmental and somatic dysfunction of lumbar region: Secondary | ICD-10-CM | POA: Diagnosis not present

## 2017-04-01 DIAGNOSIS — M6240 Contracture of muscle, unspecified site: Secondary | ICD-10-CM | POA: Diagnosis not present

## 2017-04-01 DIAGNOSIS — R293 Abnormal posture: Secondary | ICD-10-CM | POA: Diagnosis not present

## 2017-04-01 DIAGNOSIS — M5137 Other intervertebral disc degeneration, lumbosacral region: Secondary | ICD-10-CM | POA: Diagnosis not present

## 2017-04-01 DIAGNOSIS — Z510 Encounter for antineoplastic radiation therapy: Secondary | ICD-10-CM | POA: Diagnosis not present

## 2017-04-01 DIAGNOSIS — D0512 Intraductal carcinoma in situ of left breast: Secondary | ICD-10-CM | POA: Diagnosis not present

## 2017-04-02 ENCOUNTER — Ambulatory Visit
Admission: RE | Admit: 2017-04-02 | Discharge: 2017-04-02 | Disposition: A | Discharge status: Home / Self Care | Payer: Medicare Other | Source: Ambulatory Visit | Attending: Radiation Oncology | Admitting: Radiation Oncology

## 2017-04-02 DIAGNOSIS — D0512 Intraductal carcinoma in situ of left breast: Secondary | ICD-10-CM | POA: Diagnosis not present

## 2017-04-02 DIAGNOSIS — Z510 Encounter for antineoplastic radiation therapy: Secondary | ICD-10-CM | POA: Diagnosis not present

## 2017-04-03 ENCOUNTER — Ambulatory Visit
Admission: RE | Admit: 2017-04-03 | Discharge: 2017-04-03 | Disposition: A | Discharge status: Home / Self Care | Payer: Medicare Other | Source: Ambulatory Visit | Attending: Radiation Oncology | Admitting: Radiation Oncology

## 2017-04-03 DIAGNOSIS — M5137 Other intervertebral disc degeneration, lumbosacral region: Secondary | ICD-10-CM | POA: Diagnosis not present

## 2017-04-03 DIAGNOSIS — M6240 Contracture of muscle, unspecified site: Secondary | ICD-10-CM | POA: Diagnosis not present

## 2017-04-03 DIAGNOSIS — M9903 Segmental and somatic dysfunction of lumbar region: Secondary | ICD-10-CM | POA: Diagnosis not present

## 2017-04-03 DIAGNOSIS — R293 Abnormal posture: Secondary | ICD-10-CM | POA: Diagnosis not present

## 2017-04-03 DIAGNOSIS — D0512 Intraductal carcinoma in situ of left breast: Secondary | ICD-10-CM | POA: Diagnosis not present

## 2017-04-03 DIAGNOSIS — Z510 Encounter for antineoplastic radiation therapy: Secondary | ICD-10-CM | POA: Diagnosis not present

## 2017-04-04 ENCOUNTER — Ambulatory Visit (INDEPENDENT_AMBULATORY_CARE_PROVIDER_SITE_OTHER): Payer: Medicare Other | Admitting: Psychology

## 2017-04-04 ENCOUNTER — Ambulatory Visit
Admission: RE | Admit: 2017-04-04 | Discharge: 2017-04-04 | Disposition: A | Discharge status: Home / Self Care | Payer: Medicare Other | Source: Ambulatory Visit | Attending: Radiation Oncology | Admitting: Radiation Oncology

## 2017-04-04 DIAGNOSIS — D0512 Intraductal carcinoma in situ of left breast: Secondary | ICD-10-CM | POA: Diagnosis not present

## 2017-04-04 DIAGNOSIS — F4310 Post-traumatic stress disorder, unspecified: Secondary | ICD-10-CM

## 2017-04-04 DIAGNOSIS — Z510 Encounter for antineoplastic radiation therapy: Secondary | ICD-10-CM | POA: Diagnosis not present

## 2017-04-04 DIAGNOSIS — F4323 Adjustment disorder with mixed anxiety and depressed mood: Secondary | ICD-10-CM

## 2017-04-07 ENCOUNTER — Ambulatory Visit
Admission: RE | Admit: 2017-04-07 | Discharge: 2017-04-07 | Disposition: A | Discharge status: Home / Self Care | Payer: Medicare Other | Source: Ambulatory Visit | Attending: Radiation Oncology | Admitting: Radiation Oncology

## 2017-04-07 ENCOUNTER — Telehealth: Payer: Self-pay | Admitting: Physician Assistant

## 2017-04-07 DIAGNOSIS — D0512 Intraductal carcinoma in situ of left breast: Secondary | ICD-10-CM | POA: Diagnosis not present

## 2017-04-07 DIAGNOSIS — Z510 Encounter for antineoplastic radiation therapy: Secondary | ICD-10-CM | POA: Diagnosis not present

## 2017-04-07 NOTE — Telephone Encounter (Signed)
Pt is needing a refill on Armour   Best number [phone]

## 2017-04-07 NOTE — Telephone Encounter (Signed)
I sent a 90-day supply to her pharmacy on 03/19/2017.

## 2017-04-08 ENCOUNTER — Ambulatory Visit
Admission: RE | Admit: 2017-04-08 | Discharge: 2017-04-08 | Disposition: A | Discharge status: Home / Self Care | Payer: Medicare Other | Source: Ambulatory Visit | Attending: Radiation Oncology | Admitting: Radiation Oncology

## 2017-04-08 ENCOUNTER — Telehealth: Payer: Self-pay

## 2017-04-08 ENCOUNTER — Inpatient Hospital Stay: Payer: Medicare Other | Attending: Radiation Oncology

## 2017-04-08 ENCOUNTER — Encounter: Payer: Self-pay | Admitting: Physician Assistant

## 2017-04-08 DIAGNOSIS — Z510 Encounter for antineoplastic radiation therapy: Secondary | ICD-10-CM | POA: Diagnosis not present

## 2017-04-08 DIAGNOSIS — D0512 Intraductal carcinoma in situ of left breast: Secondary | ICD-10-CM | POA: Diagnosis not present

## 2017-04-08 DIAGNOSIS — R7989 Other specified abnormal findings of blood chemistry: Secondary | ICD-10-CM | POA: Insufficient documentation

## 2017-04-08 NOTE — Telephone Encounter (Signed)
PA Started Levo/Liothyr 90mg  Awaiting Response

## 2017-04-08 NOTE — Telephone Encounter (Signed)
Spoke with patient and advised that a 90 day rx was sent in on the 13th so she should have plenty.

## 2017-04-10 ENCOUNTER — Ambulatory Visit
Admission: RE | Admit: 2017-04-10 | Discharge: 2017-04-10 | Disposition: A | Discharge status: Home / Self Care | Payer: Medicare Other | Source: Ambulatory Visit | Attending: Radiation Oncology | Admitting: Radiation Oncology

## 2017-04-10 DIAGNOSIS — M6240 Contracture of muscle, unspecified site: Secondary | ICD-10-CM | POA: Diagnosis not present

## 2017-04-10 DIAGNOSIS — M9903 Segmental and somatic dysfunction of lumbar region: Secondary | ICD-10-CM | POA: Diagnosis not present

## 2017-04-10 DIAGNOSIS — R293 Abnormal posture: Secondary | ICD-10-CM | POA: Diagnosis not present

## 2017-04-10 DIAGNOSIS — Z510 Encounter for antineoplastic radiation therapy: Secondary | ICD-10-CM | POA: Diagnosis not present

## 2017-04-10 DIAGNOSIS — D0512 Intraductal carcinoma in situ of left breast: Secondary | ICD-10-CM | POA: Diagnosis not present

## 2017-04-10 DIAGNOSIS — M5137 Other intervertebral disc degeneration, lumbosacral region: Secondary | ICD-10-CM | POA: Diagnosis not present

## 2017-04-11 ENCOUNTER — Ambulatory Visit
Admission: RE | Admit: 2017-04-11 | Discharge: 2017-04-11 | Disposition: A | Discharge status: Home / Self Care | Payer: Medicare Other | Source: Ambulatory Visit | Attending: Radiation Oncology | Admitting: Radiation Oncology

## 2017-04-11 DIAGNOSIS — D0512 Intraductal carcinoma in situ of left breast: Secondary | ICD-10-CM | POA: Diagnosis not present

## 2017-04-11 DIAGNOSIS — F341 Dysthymic disorder: Secondary | ICD-10-CM | POA: Diagnosis not present

## 2017-04-11 DIAGNOSIS — F4310 Post-traumatic stress disorder, unspecified: Secondary | ICD-10-CM | POA: Diagnosis not present

## 2017-04-11 DIAGNOSIS — Z510 Encounter for antineoplastic radiation therapy: Secondary | ICD-10-CM | POA: Diagnosis not present

## 2017-04-11 DIAGNOSIS — F411 Generalized anxiety disorder: Secondary | ICD-10-CM | POA: Diagnosis not present

## 2017-04-14 ENCOUNTER — Ambulatory Visit: Payer: Medicare Other

## 2017-04-14 DIAGNOSIS — M4726 Other spondylosis with radiculopathy, lumbar region: Secondary | ICD-10-CM | POA: Diagnosis not present

## 2017-04-14 DIAGNOSIS — M5137 Other intervertebral disc degeneration, lumbosacral region: Secondary | ICD-10-CM | POA: Diagnosis not present

## 2017-04-14 DIAGNOSIS — R293 Abnormal posture: Secondary | ICD-10-CM | POA: Diagnosis not present

## 2017-04-14 DIAGNOSIS — R202 Paresthesia of skin: Secondary | ICD-10-CM | POA: Diagnosis not present

## 2017-04-14 DIAGNOSIS — M9903 Segmental and somatic dysfunction of lumbar region: Secondary | ICD-10-CM | POA: Diagnosis not present

## 2017-04-14 DIAGNOSIS — M6240 Contracture of muscle, unspecified site: Secondary | ICD-10-CM | POA: Diagnosis not present

## 2017-04-14 NOTE — Telephone Encounter (Signed)
PA denied for Levo/Liothyr 90 mg    Not a PART D Medication that is eligible for benefits. Alternatives: Synthroid, Levoxyl.  Appeal # [id number]  Case ID: [id number]

## 2017-04-14 NOTE — Telephone Encounter (Signed)
Please let this patient know, and ask how she prefers to proceed.

## 2017-04-15 ENCOUNTER — Emergency Department
Admission: EM | Admit: 2017-04-15 | Discharge: 2017-04-15 | Disposition: A | Discharge status: Home / Self Care | Payer: Medicare Other | Attending: Emergency Medicine | Admitting: Emergency Medicine

## 2017-04-15 ENCOUNTER — Ambulatory Visit: Payer: Medicare Other

## 2017-04-15 ENCOUNTER — Telehealth: Payer: Self-pay | Admitting: *Deleted

## 2017-04-15 ENCOUNTER — Telehealth: Payer: Self-pay | Admitting: Physician Assistant

## 2017-04-15 DIAGNOSIS — E039 Hypothyroidism, unspecified: Secondary | ICD-10-CM | POA: Diagnosis not present

## 2017-04-15 DIAGNOSIS — F329 Major depressive disorder, single episode, unspecified: Secondary | ICD-10-CM

## 2017-04-15 DIAGNOSIS — Z79899 Other long term (current) drug therapy: Secondary | ICD-10-CM | POA: Insufficient documentation

## 2017-04-15 DIAGNOSIS — R45851 Suicidal ideations: Secondary | ICD-10-CM

## 2017-04-15 DIAGNOSIS — F32A Depression, unspecified: Secondary | ICD-10-CM

## 2017-04-15 DIAGNOSIS — F331 Major depressive disorder, recurrent, moderate: Secondary | ICD-10-CM

## 2017-04-15 DIAGNOSIS — F4310 Post-traumatic stress disorder, unspecified: Secondary | ICD-10-CM | POA: Diagnosis present

## 2017-04-15 DIAGNOSIS — Z7982 Long term (current) use of aspirin: Secondary | ICD-10-CM | POA: Diagnosis not present

## 2017-04-15 DIAGNOSIS — I10 Essential (primary) hypertension: Secondary | ICD-10-CM | POA: Insufficient documentation

## 2017-04-15 LAB — COMPREHENSIVE METABOLIC PANEL
ALT: 41 U/L (ref 14–54)
AST: 39 U/L (ref 15–41)
Albumin: 4.4 g/dL (ref 3.5–5.0)
Alkaline Phosphatase: 72 U/L (ref 38–126)
Anion gap: 8 (ref 5–15)
BILIRUBIN TOTAL: 0.3 mg/dL (ref 0.3–1.2)
BUN: 19 mg/dL (ref 6–20)
CALCIUM: 10.3 mg/dL (ref 8.9–10.3)
CO2: 29 mmol/L (ref 22–32)
CREATININE: 0.88 mg/dL (ref 0.44–1.00)
Chloride: 104 mmol/L (ref 101–111)
Glucose, Bld: 86 mg/dL (ref 65–99)
Potassium: 3.4 mmol/L — ABNORMAL LOW (ref 3.5–5.1)
Sodium: 141 mmol/L (ref 135–145)
TOTAL PROTEIN: 7.7 g/dL (ref 6.5–8.1)

## 2017-04-15 LAB — URINE DRUG SCREEN, QUALITATIVE (ARMC ONLY)
Amphetamines, Ur Screen: NOT DETECTED
BARBITURATES, UR SCREEN: NOT DETECTED
BENZODIAZEPINE, UR SCRN: NOT DETECTED
CANNABINOID 50 NG, UR ~~LOC~~: POSITIVE — AB
Cocaine Metabolite,Ur ~~LOC~~: NOT DETECTED
MDMA (ECSTASY) UR SCREEN: NOT DETECTED
Methadone Scn, Ur: NOT DETECTED
Opiate, Ur Screen: NOT DETECTED
PHENCYCLIDINE (PCP) UR S: NOT DETECTED
Tricyclic, Ur Screen: NOT DETECTED

## 2017-04-15 LAB — CBC
HCT: 39.4 % (ref 35.0–47.0)
Hemoglobin: 13.5 g/dL (ref 12.0–16.0)
MCH: 29.1 pg (ref 26.0–34.0)
MCHC: 34.3 g/dL (ref 32.0–36.0)
MCV: 84.9 fL (ref 80.0–100.0)
PLATELETS: 233 10*3/uL (ref 150–440)
RBC: 4.64 MIL/uL (ref 3.80–5.20)
RDW: 13.6 % (ref 11.5–14.5)
WBC: 5.9 10*3/uL (ref 3.6–11.0)

## 2017-04-15 LAB — TSH: TSH: 0.033 u[IU]/mL — AB (ref 0.350–4.500)

## 2017-04-15 LAB — SALICYLATE LEVEL

## 2017-04-15 LAB — ACETAMINOPHEN LEVEL: Acetaminophen (Tylenol), Serum: <10 ug/mL — ABNORMAL LOW (ref 10–30)

## 2017-04-15 LAB — ETHANOL

## 2017-04-15 MED ORDER — BUPROPION HCL ER (XL) 150 MG PO TB24: 150.0000 mg | ORAL_TABLET | Freq: Every day | ORAL | 1 refills | Status: DC

## 2017-04-15 NOTE — Telephone Encounter (Signed)
Received call from [facility] in triage that the patient was depressed and crying.  Called patient to offer counseling services.  Patient states she had periods of depression prior to her cancer diagnosis.  States she needs some psychiatric meds.  States her psychiatrist has had a heart attack and doesn't have anyone to see right now.  States she has called the emergency room and is leaving now to go there for treatment.  Informed her that going to the ED was a good decision and she would be well cared for.  States she is on her way.

## 2017-04-15 NOTE — Telephone Encounter (Signed)
Concerned for the patient. As called 911 to request a welfare check on this patient, noted that the patient is presently in the ED and being admitted to behavioral health due to suicidal ideations.

## 2017-04-15 NOTE — ED Notes (Signed)
BEHAVIORAL HEALTH ROUNDING Patient sleeping: No. Patient alert and oriented: yes Behavior appropriate: Yes.  ; If no, describe:  Nutrition and fluids offered: yes Toileting and hygiene offered: Yes  Sitter present: q15 minute observations and security  monitoring Law enforcement present: Yes  ODS  

## 2017-04-15 NOTE — Telephone Encounter (Signed)
I will definitely refer her to psychiatry. It often takes several months to get an appointment, so I recommend that in the meantime she do the following: 1. INCREASE the escitalopram (Lexapro) from 10 mg to 20 mg. 2. INCREASE the trazodone from 50 mg at bedtime to 100 mg at bedtime, IF she is having trouble sleeping. 3. Schedule a follow-up visit with me for about 4 weeks after she increases the escitalopram so we can discuss how she is improving, unless she can see psychiatry by then.

## 2017-04-15 NOTE — Telephone Encounter (Signed)
Yes. Patient needs follow-up here with me this week. My Chart message sent to patient. I have sent a message to the referrals pool, asking that we proceed with the psychiatry referral, as planned previous to the emergency visit today.

## 2017-04-15 NOTE — ED Notes (Signed)
Pt asking if she will be out of here by 2:30 then states she "has radiation appointment at cancer center today at 2:45pm and another appointment in [place] today at [facility] that I would love to go to."

## 2017-04-15 NOTE — ED Triage Notes (Signed)
Pt states she is currently getting radiation treatment for breast cancer and has a hx of depression/anxiety and PTSD, states she is having increased suicidal ideations without intent,. States the psychiatrist she was seeing passed away and she is just going through a tough time in her life.

## 2017-04-15 NOTE — ED Provider Notes (Signed)
-----------------------------------------   5:15 PM on 04/15/2017 -----------------------------------------  Seen and evaluated by psychiatrist, they do not feel the patient meets criteria for inpatient care. He will change her medications and have her follow up. He refers to IVC. Patient to return to the emergency room for any new or worrisome symptoms. He has arranged outpatient follow-up for her. She has no ongoing SI or HI and she is safe per psychiatry for discharge, in addition, we don't see any acute medical issues to keep the patient in the hospital should prefer to go home   Schuyler Amor, MD 04/15/17 [phone]

## 2017-04-15 NOTE — ED Notes (Signed)

## 2017-04-15 NOTE — ED Provider Notes (Signed)
[facility] Emergency Department Provider Note  ____________________________________________   First MD Initiated Contact with Patient 04/15/17 1359     (approximate)  I have reviewed the triage vital signs and the nursing notes.   HISTORY  Chief Complaint Psychiatric Evaluation   HPI Angela Murphy is a 71 y.o. female with a history of anxiety and depression is present to the emergency department with suicidal ideation. She says that she is having suicidal thoughts without specific plan. 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 She denies any toxic ingestions or hurting herself prior to coming to the hospital today.   Past Medical History:  Diagnosis Date  . ADHD   . Allergy   . Anemia   . Anxiety   . Arthritis   . 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 Cataract   . Chicken pox   . Cognitive decline   . Depression   . Hypertension   . Hypertriglyceridemia   . Hypothyroidism   . Neuromuscular disorder ([place])    peripheral neuropathy  . Neuropathy   . Post traumatic stress disorder   . Sleep apnea    uses cpap  . Thyroid disease     Patient Active Problem List   Diagnosis Date Noted  . 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 Diarrhea 01/02/2017  . Abnormal brain scan 09/06/2016  . HLD (hyperlipidemia) 02/20/2016  . HTN (hypertension) 02/20/2016  . Anxiety and depression 02/20/2016  . ADD (attention deficit disorder) 02/20/2016  . Insomnia 02/20/2016  . Seasonal allergies 02/20/2016  . Peripheral neuropathy 02/20/2016  . PTSD (post-traumatic stress disorder) 09/07/2015  . MCI (mild cognitive impairment) 10/07/2014  . Sleep apnea 01/05/2006    Past Surgical History:  Procedure Laterality Date  . BREAST BIOPSY Left 12/25/2016   DCIS   . BREAST EXCISIONAL BIOPSY Left 02/07/2017   lumpectomy DCIS   . CATARACT EXTRACTION W/ INTRAOCULAR LENS  IMPLANT, BILATERAL    . EYE SURGERY Bilateral   . MASTECTOMY, PARTIAL Left 02/27/2017   Procedure: MASTECTOMY PARTIAL;  Surgeon: Leonie Green, MD;  Location: [facility] ORS;  Service: General;  Laterality: Left;  . PARTIAL MASTECTOMY WITH NEEDLE LOCALIZATION Left 02/07/2017   Procedure: PARTIAL MASTECTOMY WITH NEEDLE LOCALIZATION;  Surgeon: Leonie Green, MD;  Location: [facility] ORS;  Service: General;  Laterality: Left;  . SENTINEL NODE BIOPSY Left 02/07/2017   Procedure: SENTINEL NODE BIOPSY;  Surgeon: Leonie Green, MD;  Location: [facility] ORS;  Service: General;  Laterality: Left;  . THYROIDECTOMY, PARTIAL  1974  . TONSILLECTOMY      Prior to Admission medications   Medication Sig Start Date End Date Taking? Authorizing Provider  amphetamine-dextroamphetamine (ADDERALL) 20 MG tablet Take 5 mg by mouth daily. 0.25 tablet    [provider]  aspirin 81 MG chewable tablet Chew 1 tablet (81 mg total) by mouth daily. 08/08/16   Quintella Reichert, MD  busPIRone (BUSPAR) 30 MG tablet Take 1 tablet (30 mg total) by mouth 2 (two) times daily. 03/19/17   Harrison Mons, PA-C  Cholecalciferol (VITAMIN D3) 5000 units TABS Take 10,000 Units by mouth daily with breakfast.     [provider]  escitalopram (LEXAPRO) 10 MG tablet Take 1 tablet (10 mg total) by mouth daily. 03/19/17   Harrison Mons, PA-C  ezetimibe (ZETIA) 10 MG  tablet Take 1 tablet (10 mg total) by mouth at bedtime. 03/19/17   Harrison Mons, PA-C  fexofenadine (ALLEGRA) 180 MG tablet Take 180 mg by mouth every morning.    [provider]  glucosamine-chondroitin 500-400 MG tablet Take 1 tablet by mouth 2 (two) times daily.    [provider]  HYDROcodone-acetaminophen (NORCO) 5-325 MG tablet Take 1-2 tablets by mouth every 4 (four) hours as needed for moderate pain. 02/07/17   Leonie Green, MD  ibuprofen (ADVIL,MOTRIN) 200 MG tablet  Take 200-400 mg by mouth every 8 (eight) hours as needed (for pain.).    [provider]  lisinopril (PRINIVIL,ZESTRIL) 20 MG tablet Take 1 tablet (20 mg total) by mouth daily. 03/19/17   Jeffery, Chelle, PA-C  NON FORMULARY Take 2,000 mg by mouth daily with breakfast. MUCUNA PRURIENS 500 MG CAPSULES    [provider]  Omega-3 Fatty Acids (FISH OIL) 500 MG CAPS Take 500 mg by mouth every evening.    [provider]  OVER THE COUNTER MEDICATION Take 100 mg by mouth daily with breakfast. BACOPA FOR COGNITIVE & ENERGY FUNCTIONS    [provider]  Probiotic Product (PROBIOTIC PO) Take 2 capsules by mouth daily. MEGASPORE PROBIOTICS    [provider]  Sodium Fluoride (PREVIDENT 5000 DRY MOUTH DT) Place 1 application onto teeth daily.    [provider]  thyroid (ARMOUR) 90 MG tablet Take 1 tablet (90 mg total) by mouth daily before breakfast. 03/19/17   Harrison Mons, PA-C  traZODone (DESYREL) 50 MG tablet Take 50 mg by mouth at bedtime as needed for sleep.     [provider]  zinc gluconate 50 MG tablet Take 50 mg by mouth at bedtime.     [provider]    Allergies Other  Family History  Problem Relation Age of Onset  . Arthritis Mother   . Heart disease Mother   . Heart disease Father   . Mental illness Maternal Uncle   . Arthritis Maternal Grandmother   . Heart disease Maternal Grandmother   . Alcohol abuse Maternal Grandfather   . Heart disease Maternal Grandfather   . Heart disease Paternal Grandmother   . Heart disease Paternal Grandfather     Social History Social History  Substance Use Topics  . Smoking status: Never Smoker  . Smokeless tobacco: Never Used  . Alcohol use 0.0 oz/week     Comment: rarely    Review of Systems  Constitutional: No fever/chills Eyes: No visual changes. ENT: No sore throat. Cardiovascular: Denies chest pain. Respiratory: Denies shortness of breath. Gastrointestinal:  No abdominal pain.  No nausea, no vomiting.  No diarrhea.  No constipation. Genitourinary: Negative for dysuria. Musculoskeletal: Negative for back pain. Skin: Negative for rash. Neurological: Negative for headaches, focal weakness or numbness.   ____________________________________________   PHYSICAL EXAM:  VITAL SIGNS: ED Triage Vitals [04/15/17 1327]  Enc Vitals Group     BP (!) 154/86     Pulse Rate 62     Resp 16     Temp 98.1 F (36.7 C)     Temp Source Oral     SpO2 99 %     Weight 118 lb (53.5 kg)     Height 5\' 2"  (1.575 m)     Head Circumference      Peak Flow      Pain Score 0     Pain Loc      Pain Edu?  Excl. in [place]?     Constitutional: Alert and oriented. in no acute distress. Eyes: Conjunctivae are normal.  Head: Atraumatic. Nose: No congestion/rhinnorhea. Mouth/Throat: Mucous membranes are moist.  Neck: No stridor.   Cardiovascular: Normal rate, regular rhythm. Grossly normal heart sounds.  Good peripheral circulation. Respiratory: Normal respiratory effort.  No retractions. Lungs CTAB. Gastrointestinal: Soft and nontender. No distention. No CVA tenderness. Musculoskeletal: No lower extremity tenderness nor edema.  No joint effusions. Neurologic:  Normal speech and language. No gross focal neurologic deficits are appreciated. Skin:  Skin is warm, dry and intact. No rash noted. Psychiatric: Becomes tearful during conversation.  ____________________________________________   LABS (all labs ordered are listed, but only abnormal results are displayed)  Labs Reviewed  URINE DRUG SCREEN, QUALITATIVE ([facility] ONLY) - Abnormal; Notable for the following:       Result Value   Cannabinoid 50 Ng, Ur [place] POSITIVE (*)    All other components within normal limits  CBC  COMPREHENSIVE METABOLIC PANEL  ETHANOL  SALICYLATE LEVEL  ACETAMINOPHEN LEVEL    ____________________________________________  EKG   ____________________________________________  RADIOLOGY   ____________________________________________   PROCEDURES  Procedure(s) performed:   Procedures  Critical Care performed:   ____________________________________________   INITIAL IMPRESSION / ASSESSMENT AND PLAN / ED COURSE  Pertinent labs & imaging results that were available during my care of the patient were reviewed by me and considered in my medical decision making (see chart for details).  Patient aware of need for involuntary commitment in consultation with psychiatry. She is understanding willing to comply.      ____________________________________________   FINAL CLINICAL IMPRESSION(S) / ED DIAGNOSES  Suicidal ideation. Depression.    NEW MEDICATIONS STARTED DURING THIS VISIT:  New Prescriptions   No medications on file     Note:  This document was prepared using Dragon voice recognition software and may include unintentional dictation errors.     Orbie Pyo, MD 04/15/17 [phone]

## 2017-04-15 NOTE — Telephone Encounter (Signed)
See previous documentation

## 2017-04-15 NOTE — Telephone Encounter (Signed)
Pt left message for referrals stating that she is currently going through radiation for breast cancer and is feeling really depressed. She said the medication she is taking for depression is not working. Pt is requesting a referral for psychiatry. Please advise. Pt callback number is [phone].

## 2017-04-15 NOTE — Telephone Encounter (Signed)
Called patient. She state's she has paid cash for it and she will call her insurance to discuss this issue.

## 2017-04-15 NOTE — Telephone Encounter (Signed)
Please give her an appointment to see me after she completes her radiation therapy to discuss hormone therapy. She does not have one.   Let me know how she is doing emotionally once you get an update  Thanks, Astrid Divine

## 2017-04-15 NOTE — Telephone Encounter (Signed)
Below is the ED provider note. Please advised if you would like the pt scheduled for a follow up please.  Seen and evaluated by psychiatrist, they do not feel the patient meets criteria for inpatient care. He will change her medications and have her follow up. He refers to IVC. Patient to return to the emergency room for any new or worrisome symptoms. He has arranged outpatient follow-up for her. She has no ongoing SI or HI and she is safe per psychiatry for discharge, in addition, we don't see any acute medical issues to keep the patient in the hospital should prefer to go home   Schuyler Amor, MD 04/15/17 [phone]

## 2017-04-15 NOTE — Telephone Encounter (Signed)
Attempted to reach PT twice. Her phone is disconnected. I called her friend on her release of information and her friend states that Angela Murphy left a message stating that her depression was really bad today and might be going to the ED. I asked for her friend to get in touch with Angela Murphy and have her give [place] a call back.
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
## 2017-04-15 NOTE — Telephone Encounter (Signed)
Patietn went to ED, she had been on Psych meds until recently when her doctor went out for medical reasons and she has no one to prescribe for her. She is currently in ED being evaluated

## 2017-04-15 NOTE — Telephone Encounter (Signed)
Patient called upset and crying stating she is just so depressed that she does not know what to do and is requesting a call back. I called Sheena (navigator) and shew will contact patient and have her to call the counselor

## 2017-04-15 NOTE — ED Notes (Addendum)
Pt dressed out into appropriate behavioral health clothing. Pt belongings consist of two white rings one with green stones and the other with purple stones, a grey jacket, purple shirt, grey bra, grey pants, grey tennis shoes, white panties, a tan hair bow, a black head band, a pink bag with a crochet project pt is working on, a Engineer, manufacturing systems with 3 $20 bills and 1 $1 bill and a white cell phone with a rose gold case.
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
## 2017-04-16 ENCOUNTER — Ambulatory Visit
Admission: RE | Admit: 2017-04-16 | Discharge: 2017-04-16 | Disposition: A | Discharge status: Home / Self Care | Payer: Medicare Other | Source: Ambulatory Visit | Attending: Radiation Oncology | Admitting: Radiation Oncology

## 2017-04-16 ENCOUNTER — Ambulatory Visit: Payer: Medicare Other

## 2017-04-16 DIAGNOSIS — Z510 Encounter for antineoplastic radiation therapy: Secondary | ICD-10-CM | POA: Diagnosis not present

## 2017-04-16 DIAGNOSIS — D0512 Intraductal carcinoma in situ of left breast: Secondary | ICD-10-CM | POA: Diagnosis not present

## 2017-04-16 NOTE — Addendum Note (Signed)
Addended by: Fara Chute on: 04/16/2017 11:15 AM   Modules accepted: Orders

## 2017-04-17 ENCOUNTER — Ambulatory Visit
Admission: RE | Admit: 2017-04-17 | Discharge: 2017-04-17 | Disposition: A | Discharge status: Home / Self Care | Payer: Medicare Other | Source: Ambulatory Visit | Attending: Radiation Oncology | Admitting: Radiation Oncology

## 2017-04-17 ENCOUNTER — Ambulatory Visit: Payer: Medicare Other

## 2017-04-17 DIAGNOSIS — M6240 Contracture of muscle, unspecified site: Secondary | ICD-10-CM | POA: Diagnosis not present

## 2017-04-17 DIAGNOSIS — R293 Abnormal posture: Secondary | ICD-10-CM | POA: Diagnosis not present

## 2017-04-17 DIAGNOSIS — M9903 Segmental and somatic dysfunction of lumbar region: Secondary | ICD-10-CM | POA: Diagnosis not present

## 2017-04-17 DIAGNOSIS — Z510 Encounter for antineoplastic radiation therapy: Secondary | ICD-10-CM | POA: Diagnosis not present

## 2017-04-17 DIAGNOSIS — M461 Sacroiliitis, not elsewhere classified: Secondary | ICD-10-CM | POA: Diagnosis not present

## 2017-04-17 DIAGNOSIS — M5137 Other intervertebral disc degeneration, lumbosacral region: Secondary | ICD-10-CM | POA: Diagnosis not present

## 2017-04-17 DIAGNOSIS — M5127 Other intervertebral disc displacement, lumbosacral region: Secondary | ICD-10-CM | POA: Diagnosis not present

## 2017-04-17 DIAGNOSIS — M791 Myalgia: Secondary | ICD-10-CM | POA: Diagnosis not present

## 2017-04-17 DIAGNOSIS — D0512 Intraductal carcinoma in situ of left breast: Secondary | ICD-10-CM | POA: Diagnosis not present

## 2017-04-18 ENCOUNTER — Ambulatory Visit (INDEPENDENT_AMBULATORY_CARE_PROVIDER_SITE_OTHER): Payer: Medicare Other | Admitting: Psychology

## 2017-04-18 ENCOUNTER — Ambulatory Visit
Admission: RE | Admit: 2017-04-18 | Discharge: 2017-04-18 | Disposition: A | Discharge status: Home / Self Care | Payer: Medicare Other | Source: Ambulatory Visit | Attending: Radiation Oncology | Admitting: Radiation Oncology

## 2017-04-18 DIAGNOSIS — F4310 Post-traumatic stress disorder, unspecified: Secondary | ICD-10-CM

## 2017-04-18 DIAGNOSIS — F4323 Adjustment disorder with mixed anxiety and depressed mood: Secondary | ICD-10-CM

## 2017-04-18 DIAGNOSIS — Z510 Encounter for antineoplastic radiation therapy: Secondary | ICD-10-CM | POA: Diagnosis not present

## 2017-04-18 DIAGNOSIS — D0512 Intraductal carcinoma in situ of left breast: Secondary | ICD-10-CM | POA: Diagnosis not present

## 2017-04-21 ENCOUNTER — Ambulatory Visit: Payer: Medicare Other

## 2017-04-21 ENCOUNTER — Ambulatory Visit
Admission: RE | Admit: 2017-04-21 | Discharge: 2017-04-21 | Disposition: A | Discharge status: Home / Self Care | Payer: Medicare Other | Source: Ambulatory Visit | Attending: Radiation Oncology | Admitting: Radiation Oncology

## 2017-04-21 DIAGNOSIS — Z510 Encounter for antineoplastic radiation therapy: Secondary | ICD-10-CM | POA: Diagnosis not present

## 2017-04-21 DIAGNOSIS — D0512 Intraductal carcinoma in situ of left breast: Secondary | ICD-10-CM | POA: Diagnosis not present

## 2017-04-22 ENCOUNTER — Inpatient Hospital Stay: Payer: Medicare Other

## 2017-04-22 ENCOUNTER — Ambulatory Visit
Admission: RE | Admit: 2017-04-22 | Discharge: 2017-04-22 | Disposition: A | Discharge status: Home / Self Care | Payer: Medicare Other | Source: Ambulatory Visit | Attending: Radiation Oncology | Admitting: Radiation Oncology

## 2017-04-22 ENCOUNTER — Ambulatory Visit: Payer: Medicare Other

## 2017-04-22 DIAGNOSIS — D0512 Intraductal carcinoma in situ of left breast: Secondary | ICD-10-CM | POA: Diagnosis not present

## 2017-04-22 DIAGNOSIS — Z510 Encounter for antineoplastic radiation therapy: Secondary | ICD-10-CM | POA: Diagnosis not present

## 2017-04-23 ENCOUNTER — Ambulatory Visit
Admission: RE | Admit: 2017-04-23 | Discharge: 2017-04-23 | Disposition: A | Discharge status: Home / Self Care | Payer: Medicare Other | Source: Ambulatory Visit | Attending: Radiation Oncology | Admitting: Radiation Oncology

## 2017-04-23 ENCOUNTER — Ambulatory Visit: Payer: Medicare Other

## 2017-04-23 DIAGNOSIS — D0512 Intraductal carcinoma in situ of left breast: Secondary | ICD-10-CM | POA: Diagnosis not present

## 2017-04-23 DIAGNOSIS — Z510 Encounter for antineoplastic radiation therapy: Secondary | ICD-10-CM | POA: Diagnosis not present

## 2017-04-23 DIAGNOSIS — F331 Major depressive disorder, recurrent, moderate: Secondary | ICD-10-CM | POA: Diagnosis not present

## 2017-04-24 ENCOUNTER — Ambulatory Visit (INDEPENDENT_AMBULATORY_CARE_PROVIDER_SITE_OTHER): Payer: Medicare Other | Admitting: Physician Assistant

## 2017-04-24 ENCOUNTER — Encounter: Payer: Self-pay | Admitting: Physician Assistant

## 2017-04-24 ENCOUNTER — Ambulatory Visit
Admission: RE | Admit: 2017-04-24 | Discharge: 2017-04-24 | Disposition: A | Discharge status: Home / Self Care | Payer: Medicare Other | Source: Ambulatory Visit | Attending: Radiation Oncology | Admitting: Radiation Oncology

## 2017-04-24 ENCOUNTER — Ambulatory Visit: Payer: Medicare Other

## 2017-04-24 VITALS — BP 134/80 | HR 68 | Temp 97.6°F | Resp 18 | Ht 62.0 in | Wt 117.0 lb

## 2017-04-24 DIAGNOSIS — E039 Hypothyroidism, unspecified: Secondary | ICD-10-CM | POA: Insufficient documentation

## 2017-04-24 DIAGNOSIS — E785 Hyperlipidemia, unspecified: Secondary | ICD-10-CM | POA: Diagnosis not present

## 2017-04-24 DIAGNOSIS — D0512 Intraductal carcinoma in situ of left breast: Secondary | ICD-10-CM | POA: Diagnosis not present

## 2017-04-24 DIAGNOSIS — F331 Major depressive disorder, recurrent, moderate: Secondary | ICD-10-CM

## 2017-04-24 DIAGNOSIS — I10 Essential (primary) hypertension: Secondary | ICD-10-CM | POA: Diagnosis not present

## 2017-04-24 DIAGNOSIS — E890 Postprocedural hypothyroidism: Secondary | ICD-10-CM | POA: Diagnosis not present

## 2017-04-24 DIAGNOSIS — F988 Other specified behavioral and emotional disorders with onset usually occurring in childhood and adolescence: Secondary | ICD-10-CM | POA: Diagnosis not present

## 2017-04-24 DIAGNOSIS — F419 Anxiety disorder, unspecified: Secondary | ICD-10-CM

## 2017-04-24 DIAGNOSIS — G4700 Insomnia, unspecified: Secondary | ICD-10-CM

## 2017-04-24 DIAGNOSIS — R7989 Other specified abnormal findings of blood chemistry: Secondary | ICD-10-CM | POA: Diagnosis not present

## 2017-04-24 DIAGNOSIS — F329 Major depressive disorder, single episode, unspecified: Secondary | ICD-10-CM

## 2017-04-24 DIAGNOSIS — Z510 Encounter for antineoplastic radiation therapy: Secondary | ICD-10-CM | POA: Diagnosis not present

## 2017-04-24 DIAGNOSIS — F32A Depression, unspecified: Secondary | ICD-10-CM

## 2017-04-24 MED ORDER — AMPHETAMINE-DEXTROAMPHETAMINE 20 MG PO TABS: 20.0000 mg | ORAL_TABLET | Freq: Every day | ORAL | 0 refills | Status: DC

## 2017-04-24 MED ORDER — BUPROPION HCL ER (XL) 150 MG PO TB24: 150.0000 mg | ORAL_TABLET | Freq: Every day | ORAL | 3 refills | Status: DC

## 2017-04-24 MED ORDER — LISINOPRIL-HYDROCHLOROTHIAZIDE 20-25 MG PO TABS: 1.0000 | ORAL_TABLET | Freq: Every day | ORAL | 3 refills | Status: AC

## 2017-04-24 NOTE — Assessment & Plan Note (Signed)
Await labs. Adjust regimen as indicated by results.  

## 2017-04-24 NOTE — Assessment & Plan Note (Signed)
Await labs. Adjust regimen as indicated by results. If dose change indicated, will change to Synthroid. If not, will continue Armour Thyroid for 2 more months, allowing for some reserve supply when try Synthroid, due to non-coverage by her health plan.

## 2017-04-24 NOTE — Assessment & Plan Note (Signed)
Inadequate control. Stop lisinopril. Start Lisinopril-HCTZ.

## 2017-04-24 NOTE — Assessment & Plan Note (Signed)
Update thyroid studies. Likely due to anxiety/depression.

## 2017-04-24 NOTE — Progress Notes (Signed)
Patient ID: Angela Murphy, female    DOB: 1946-04-21, 71 y.o.   MRN: [id number]  PCP: Harrison Mons, PA-C  Chief Complaint  Patient presents with  . Hypertension    Last visit BP 165/90, Current 157/94  . Hyperlipidemia  . Mood    Depression scale score 14    Subjective:   Presents for evaluation of HTN, depression, hypothyroidism.  I last saw her, to establish care, in March. Since then, she has undergone lumpectomy for breast cancer, stage 0, and started radiation therapy.  Blood pressure has been up and down, but most visits >140/90, based on chart review. She tolerates the lisinopril well.  Mood has been worse. She's really tired, and has had to reduce her work. 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 Cognition has improved very little.  Last week she called that she was having more depression, and then presented to the ED with suicidal thoughts. Wellbutrin, which she has used successfully in the past, was restarted, but she lost the bottle after the first week, and needs a refill. She continued on the escitalopram 20 mg, but feels like it just isn't enough, or isn't the right thing. Her psychiatrist (Dr. Bernadette Hoit, at the [facility]) has died. Nurse in the emergency department recommended [facility]. They offer walk-in psychiatric care, group therapy. Is in the process of establishing there. Had been tapering off the Adderall, but has found that she can't stop it. Has too much to do, and not enough energy, focus, attention to tolerate. Radiation-next Thursday is the last session.  Describes the recent mood as having "crashed." Had run out of her thyroid replacement x 1 week, due to the pharmacy being out of stock, and then Medicare not covering the product. She has always used Armour Thyroid, since her partial thyroidectomy. Never tried Synthroid. She was able to pay OOP for a 90-day supply, but needs a plan going forward. Recall that  her thyroid studies have been within normal range, but that her specialist had recommended we aim for lower end in the range.    Depression screen [facility] 2/9 04/24/2017 03/06/2017 01/02/2017 01/01/2017 02/20/2016  Decreased Interest 1 0 2 0 0  Down, Depressed, Hopeless 1 0 2 0 0  PHQ - 2 Score 2 0 4 0 0  Altered sleeping 2 - 3 - -  Tired, decreased energy 3 - 3 - -  Change in appetite 2 - 3 - -  Feeling bad or failure about yourself  1 - 1 - -  Trouble concentrating 3 - 3 - -  Moving slowly or fidgety/restless 0 - 0 - -  Suicidal thoughts 1 - 0 - -  PHQ-9 Score 14 - 17 - -  Difficult doing work/chores Very difficult - - - -     Review of Systems As above. No plan, intention of self harm.    Patient Active Problem List   Diagnosis Date Noted  . Moderate recurrent major depression ([place]) 04/15/2017  . 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 Diarrhea 01/02/2017  . Abnormal brain scan 09/06/2016  . HLD (hyperlipidemia) 02/20/2016  . HTN (hypertension) 02/20/2016  . Anxiety and depression 02/20/2016  . ADD (attention deficit disorder) 02/20/2016  . Insomnia 02/20/2016  . Seasonal allergies 02/20/2016  . Peripheral neuropathy 02/20/2016  . PTSD (post-traumatic stress disorder) 09/07/2015  . MCI (mild cognitive impairment) 10/07/2014  . Sleep  apnea 01/05/2006     Prior to Admission medications   Medication Sig Start Date End Date Taking? Authorizing Provider  amphetamine-dextroamphetamine (ADDERALL) 20 MG tablet Take 5 mg by mouth daily. 0.25 tablet   Yes [provider]  aspirin 81 MG chewable tablet Chew 1 tablet (81 mg total) by mouth daily. 08/08/16  Yes Quintella Reichert, MD  busPIRone (BUSPAR) 30 MG tablet Take 1 tablet (30 mg total) by mouth 2 (two) times daily. 03/19/17  Yes Teller Wakefield, PA-C  Cholecalciferol (VITAMIN D3) 5000 units TABS Take 10,000 Units by mouth daily with breakfast.    Yes  [provider]  escitalopram (LEXAPRO) 10 MG tablet Take 1 tablet (10 mg total) by mouth daily. 03/19/17  Yes Wilfrido Luedke, PA-C  ezetimibe (ZETIA) 10 MG tablet Take 1 tablet (10 mg total) by mouth at bedtime. 03/19/17  Yes Cachet Mccutchen, PA-C  fexofenadine (ALLEGRA) 180 MG tablet Take 180 mg by mouth every morning.   Yes [provider]  glucosamine-chondroitin 500-400 MG tablet Take 1 tablet by mouth 2 (two) times daily.   Yes [provider]  ibuprofen (ADVIL,MOTRIN) 200 MG tablet Take 200-400 mg by mouth every 8 (eight) hours as needed (for pain.).   Yes [provider]  lisinopril (PRINIVIL,ZESTRIL) 20 MG tablet Take 1 tablet (20 mg total) by mouth daily. 03/19/17  Yes Liberty Stead, PA-C  Multiple Vitamin (MULTIVITAMIN) capsule Take 1 capsule by mouth daily.   Yes [provider]  NON FORMULARY Take 2,000 mg by mouth daily with breakfast. MUCUNA PRURIENS 500 MG CAPSULES   Yes [provider]  Omega-3 Fatty Acids (FISH OIL) 500 MG CAPS Take 500 mg by mouth every evening.   Yes [provider]  OVER THE COUNTER MEDICATION Take 100 mg by mouth daily with breakfast. BACOPA FOR COGNITIVE & ENERGY FUNCTIONS   Yes [provider]  Probiotic Product (PROBIOTIC PO) Take 2 capsules by mouth daily. MEGASPORE PROBIOTICS   Yes [provider]  Sodium Fluoride (PREVIDENT 5000 DRY MOUTH DT) Place 1 application onto teeth daily.   Yes [provider]  thyroid (ARMOUR) 90 MG tablet Take 1 tablet (90 mg total) by mouth daily before breakfast. 03/19/17  Yes Jestine Bicknell, PA-C  buPROPion (WELLBUTRIN XL) 150 MG 24 hr tablet Take 1 tablet (150 mg total) by mouth daily. Patient not taking: Reported on 04/24/2017 04/15/17   Clapacs, Madie Reno, MD  HYDROcodone-acetaminophen (NORCO) 5-325 MG tablet Take 1-2 tablets by mouth every 4 (four) hours as needed for moderate pain. Patient not taking: Reported on 04/24/2017 02/07/17    Leonie Green, MD  traZODone (DESYREL) 50 MG tablet Take 50 mg by mouth at bedtime as needed for sleep.     [provider]  zinc gluconate 50 MG tablet Take 50 mg by mouth at bedtime.     [provider]     Allergies  Allergen Reactions  . Other     Seasonal allergies--runny nose, sneezing.       Objective:  Physical Exam  Constitutional: She is oriented to person, place, and time. She appears well-developed and well-nourished. She is active and cooperative. No distress.  BP 134/80 (BP Location: Right Arm, Patient Position: Sitting, Cuff Size: Normal)   Pulse 68   Temp 97.6 F (36.4 C) (Oral)   Resp 18   Ht 5\' 2"  (1.575 m)   Wt 117 lb (53.1 kg)   SpO2 98%   BMI 21.40 kg/m   HENT:  Head: Normocephalic and atraumatic.  Right Ear: Hearing normal.  Left Ear: Hearing normal.  Eyes: Conjunctivae are normal. No scleral icterus.  Neck: Normal range of motion. Neck supple. No thyromegaly present.  Cardiovascular: Normal rate, regular rhythm and normal heart sounds.   Pulses:      Radial pulses are 2+ on the right side, and 2+ on the left side.  Pulmonary/Chest: Effort normal and breath sounds normal.  Lymphadenopathy:       Head (right side): No tonsillar, no preauricular, no posterior auricular and no occipital adenopathy present.       Head (left side): No tonsillar, no preauricular, no posterior auricular and no occipital adenopathy present.    She has no cervical adenopathy.       Right: No supraclavicular adenopathy present.       Left: No supraclavicular adenopathy present.  Neurological: She is alert and oriented to person, place, and time. No sensory deficit.  Skin: Skin is warm, dry and intact. No rash noted. No cyanosis or erythema. Nails show no clubbing.  Psychiatric: She has a normal mood and affect. Her speech is normal and behavior is normal.        Assessment & Plan:   Problem List Items Addressed This Visit    HLD (hyperlipidemia)     Await labs. Adjust regimen as indicated by results.       Relevant Medications   lisinopril-hydrochlorothiazide (PRINZIDE,ZESTORETIC) 20-25 MG tablet   Other Relevant Orders   Lipid panel   HTN (hypertension) - Primary    Inadequate control. Stop lisinopril. Start Lisinopril-HCTZ.       Relevant Medications   lisinopril-hydrochlorothiazide (PRINZIDE,ZESTORETIC) 20-25 MG tablet   Other Relevant Orders   TSH   T4, free   Anxiety and depression    Recent suicidal thoughts. Currently establishing with new, local, psychiatrist. Continue escitalopram 20 mg and resume bupropion XL 150 mg daily. If she tolerates the bupropion, consider INCREASING to 300 mg and then consider reducing the escitalopram.      Relevant Medications   buPROPion (WELLBUTRIN XL) 150 MG 24 hr tablet   ADD (attention deficit disorder)    Continue current treatment.      Relevant Medications   amphetamine-dextroamphetamine (ADDERALL) 20 MG tablet   Insomnia    Update thyroid studies. Likely due to anxiety/depression.      Relevant Orders   TSH   T4, free   Low serum triiodothyronine (T3)    Await labs. Adjust regimen as indicated by results. If dose change indicated, will change to Synthroid. If not, will continue Armour Thyroid for 2 more months, allowing for some reserve supply when try Synthroid, due to non-coverage by her health plan.      Relevant Orders   T3, Free   Moderate recurrent major depression (HCC)   Relevant Medications   buPROPion (WELLBUTRIN XL) 150 MG 24 hr tablet   Hypothyroidism    Await labs. Adjust regimen as indicated by results. If dose change indicated, will change to Synthroid. If not, will continue Armour Thyroid for 2 more months, allowing for some reserve supply when try Synthroid, due to non-coverage by her health plan.          Return in about 6 weeks (around 06/05/2017) for re-evaluation of blood pressure.   Fara Chute, PA-C Primary Care at  [place]

## 2017-04-24 NOTE — Assessment & Plan Note (Signed)
Continue current treatment. 

## 2017-04-24 NOTE — Patient Instructions (Addendum)
1. Restart the Wellbutrin. 2. Continue the Armour Thyroid for now. If we need to adjust the dose, we'll go ahead and switch your to Synthroid. If not, we will wait about 60 days to make the change because 1. You just purchased a 90-day supply, and 2. We'd like you to have some "in reserve" in case you do not tolerate the Synthroid. 3. Bring me the letter from your insurance regarding the un-covered lab tests. I'll see what I can figure out. 4. STOP the lisinopril. START the lisinopril-HCTZ in it's place.   IF you received an x-ray today, you will receive an invoice from [facility]. Please contact [facility] at [phone] with questions or concerns regarding your invoice.   IF you received labwork today, you will receive an invoice from [facility]. Please contact [facility] at [phone] with questions or concerns regarding your invoice.   Our billing staff will not be able to assist you with questions regarding bills from these companies.  You will be contacted with the lab results as soon as they are available. The fastest way to get your results is to activate your My Chart account. Instructions are located on the last page of this paperwork. If you have not heard from [place] regarding the results in 2 weeks, please contact this office.

## 2017-04-24 NOTE — Assessment & Plan Note (Signed)
Recent suicidal thoughts. Currently establishing with new, local, psychiatrist. Continue escitalopram 20 mg and resume bupropion XL 150 mg daily. If she tolerates the bupropion, consider INCREASING to 300 mg and then consider reducing the escitalopram.

## 2017-04-25 ENCOUNTER — Ambulatory Visit: Payer: Medicare Other

## 2017-04-25 ENCOUNTER — Ambulatory Visit
Admission: RE | Admit: 2017-04-25 | Discharge: 2017-04-25 | Disposition: A | Discharge status: Home / Self Care | Payer: Medicare Other | Source: Ambulatory Visit | Attending: Radiation Oncology | Admitting: Radiation Oncology

## 2017-04-25 DIAGNOSIS — D0512 Intraductal carcinoma in situ of left breast: Secondary | ICD-10-CM | POA: Diagnosis not present

## 2017-04-25 DIAGNOSIS — Z510 Encounter for antineoplastic radiation therapy: Secondary | ICD-10-CM | POA: Diagnosis not present

## 2017-04-25 LAB — LIPID PANEL
CHOL/HDL RATIO: 4.3 ratio (ref 0.0–4.4)
Cholesterol, Total: 299 mg/dL — ABNORMAL HIGH (ref 100–199)
HDL: 69 mg/dL (ref >39)
LDL Calculated: 196 mg/dL — ABNORMAL HIGH (ref 0–99)
TRIGLYCERIDES: 168 mg/dL — AB (ref 0–149)
VLDL Cholesterol Cal: 34 mg/dL (ref 5–40)

## 2017-04-25 LAB — T3, FREE: T3 FREE: 3.9 pg/mL (ref 2.0–4.4)

## 2017-04-25 LAB — T4, FREE: FREE T4: 0.91 ng/dL (ref 0.82–1.77)

## 2017-04-25 LAB — TSH: TSH: 0.052 u[IU]/mL — ABNORMAL LOW (ref 0.450–4.500)

## 2017-04-28 ENCOUNTER — Ambulatory Visit: Payer: Medicare Other

## 2017-04-28 ENCOUNTER — Ambulatory Visit
Admission: RE | Admit: 2017-04-28 | Discharge: 2017-04-28 | Disposition: A | Discharge status: Home / Self Care | Payer: Medicare Other | Source: Ambulatory Visit | Attending: Radiation Oncology | Admitting: Radiation Oncology

## 2017-04-28 DIAGNOSIS — D0512 Intraductal carcinoma in situ of left breast: Secondary | ICD-10-CM | POA: Diagnosis not present

## 2017-04-28 DIAGNOSIS — Z510 Encounter for antineoplastic radiation therapy: Secondary | ICD-10-CM | POA: Diagnosis not present

## 2017-04-29 ENCOUNTER — Ambulatory Visit: Payer: Medicare Other

## 2017-04-29 ENCOUNTER — Ambulatory Visit
Admission: RE | Admit: 2017-04-29 | Discharge: 2017-04-29 | Disposition: A | Discharge status: Home / Self Care | Payer: Medicare Other | Source: Ambulatory Visit | Attending: Radiation Oncology | Admitting: Radiation Oncology

## 2017-04-29 DIAGNOSIS — Z510 Encounter for antineoplastic radiation therapy: Secondary | ICD-10-CM | POA: Diagnosis not present

## 2017-04-29 DIAGNOSIS — D0512 Intraductal carcinoma in situ of left breast: Secondary | ICD-10-CM | POA: Diagnosis not present

## 2017-04-30 ENCOUNTER — Ambulatory Visit
Admission: RE | Admit: 2017-04-30 | Discharge: 2017-04-30 | Disposition: A | Discharge status: Home / Self Care | Payer: Medicare Other | Source: Ambulatory Visit | Attending: Radiation Oncology | Admitting: Radiation Oncology

## 2017-04-30 ENCOUNTER — Ambulatory Visit: Payer: Medicare Other

## 2017-04-30 DIAGNOSIS — Z510 Encounter for antineoplastic radiation therapy: Secondary | ICD-10-CM | POA: Diagnosis not present

## 2017-04-30 DIAGNOSIS — D0512 Intraductal carcinoma in situ of left breast: Secondary | ICD-10-CM | POA: Diagnosis not present

## 2017-04-30 MED ORDER — LEVOTHYROXINE SODIUM 125 MCG PO TABS: 125.0000 ug | ORAL_TABLET | Freq: Every day | ORAL | 3 refills | Status: DC

## 2017-04-30 MED ORDER — ATORVASTATIN CALCIUM 10 MG PO TABS: 10.0000 mg | ORAL_TABLET | Freq: Every day | ORAL | 3 refills | Status: DC

## 2017-04-30 NOTE — Addendum Note (Signed)
Addended by: Fara Chute on: 04/30/2017 10:46 AM   Modules accepted: Orders

## 2017-05-01 ENCOUNTER — Ambulatory Visit: Payer: Medicare Other

## 2017-05-01 ENCOUNTER — Ambulatory Visit
Admission: RE | Admit: 2017-05-01 | Discharge: 2017-05-01 | Disposition: A | Discharge status: Home / Self Care | Payer: Medicare Other | Source: Ambulatory Visit | Attending: Radiation Oncology | Admitting: Radiation Oncology

## 2017-05-01 DIAGNOSIS — Z510 Encounter for antineoplastic radiation therapy: Secondary | ICD-10-CM | POA: Diagnosis not present

## 2017-05-01 DIAGNOSIS — D0512 Intraductal carcinoma in situ of left breast: Secondary | ICD-10-CM | POA: Diagnosis not present

## 2017-05-02 ENCOUNTER — Ambulatory Visit: Payer: Medicare Other

## 2017-05-02 ENCOUNTER — Ambulatory Visit (INDEPENDENT_AMBULATORY_CARE_PROVIDER_SITE_OTHER): Payer: Medicare Other | Admitting: Psychology

## 2017-05-02 DIAGNOSIS — F4323 Adjustment disorder with mixed anxiety and depressed mood: Secondary | ICD-10-CM

## 2017-05-02 DIAGNOSIS — F4310 Post-traumatic stress disorder, unspecified: Secondary | ICD-10-CM

## 2017-05-05 ENCOUNTER — Ambulatory Visit: Payer: Medicare Other

## 2017-05-06 ENCOUNTER — Ambulatory Visit: Payer: Medicare Other | Admitting: Physician Assistant

## 2017-05-06 ENCOUNTER — Inpatient Hospital Stay: Payer: Medicare Other | Admitting: Oncology

## 2017-05-06 ENCOUNTER — Ambulatory Visit: Payer: Medicare Other
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
## 2017-05-09 DIAGNOSIS — F411 Generalized anxiety disorder: Secondary | ICD-10-CM | POA: Diagnosis not present

## 2017-05-09 DIAGNOSIS — F341 Dysthymic disorder: Secondary | ICD-10-CM | POA: Diagnosis not present

## 2017-05-09 DIAGNOSIS — F4310 Post-traumatic stress disorder, unspecified: Secondary | ICD-10-CM | POA: Diagnosis not present

## 2017-05-20 DIAGNOSIS — F331 Major depressive disorder, recurrent, moderate: Secondary | ICD-10-CM | POA: Diagnosis not present

## 2017-05-23 DIAGNOSIS — F411 Generalized anxiety disorder: Secondary | ICD-10-CM | POA: Diagnosis not present

## 2017-05-23 DIAGNOSIS — F341 Dysthymic disorder: Secondary | ICD-10-CM | POA: Diagnosis not present

## 2017-05-23 DIAGNOSIS — F4310 Post-traumatic stress disorder, unspecified: Secondary | ICD-10-CM | POA: Diagnosis not present

## 2017-05-30 ENCOUNTER — Ambulatory Visit (INDEPENDENT_AMBULATORY_CARE_PROVIDER_SITE_OTHER): Payer: Medicare Other | Admitting: Psychology

## 2017-05-30 DIAGNOSIS — F4310 Post-traumatic stress disorder, unspecified: Secondary | ICD-10-CM

## 2017-05-30 DIAGNOSIS — F4323 Adjustment disorder with mixed anxiety and depressed mood: Secondary | ICD-10-CM

## 2017-06-06 ENCOUNTER — Encounter: Payer: Self-pay | Admitting: Physician Assistant

## 2017-06-06 ENCOUNTER — Ambulatory Visit (INDEPENDENT_AMBULATORY_CARE_PROVIDER_SITE_OTHER): Payer: Medicare Other | Admitting: Physician Assistant

## 2017-06-06 VITALS — BP 130/72 | HR 64 | Temp 97.7°F | Resp 18 | Ht 62.0 in | Wt 116.6 lb

## 2017-06-06 DIAGNOSIS — R7989 Other specified abnormal findings of blood chemistry: Secondary | ICD-10-CM | POA: Diagnosis not present

## 2017-06-06 DIAGNOSIS — E890 Postprocedural hypothyroidism: Secondary | ICD-10-CM

## 2017-06-06 DIAGNOSIS — E785 Hyperlipidemia, unspecified: Secondary | ICD-10-CM

## 2017-06-06 DIAGNOSIS — F341 Dysthymic disorder: Secondary | ICD-10-CM | POA: Diagnosis not present

## 2017-06-06 DIAGNOSIS — R946 Abnormal results of thyroid function studies: Secondary | ICD-10-CM

## 2017-06-06 DIAGNOSIS — I10 Essential (primary) hypertension: Secondary | ICD-10-CM

## 2017-06-06 DIAGNOSIS — Z23 Encounter for immunization: Secondary | ICD-10-CM | POA: Diagnosis not present

## 2017-06-06 DIAGNOSIS — F331 Major depressive disorder, recurrent, moderate: Secondary | ICD-10-CM | POA: Diagnosis not present

## 2017-06-06 DIAGNOSIS — F411 Generalized anxiety disorder: Secondary | ICD-10-CM | POA: Diagnosis not present

## 2017-06-06 DIAGNOSIS — F4310 Post-traumatic stress disorder, unspecified: Secondary | ICD-10-CM | POA: Diagnosis not present

## 2017-06-06 NOTE — Progress Notes (Signed)
Patient ID: Angela Murphy, female    DOB: 16-Jan-1946, 71 y.o.   MRN: [id number]  PCP: Harrison Mons, PA-C  Chief Complaint  Patient presents with  . Hypertension  . Follow-up    Subjective:   Presents for evaluation of HTN and thyroid since changing from [facility] Thyroif to levothyroxine..  Completed radiation therapy for breast cancer on 05/01/2017. Was to see Dr. Janese Banks, but had to cancel and hasn't rescheduled.  Has stopped the Adderall. "Goal is to get off all the psych medicines." "I'm not depressed any more." Has "discovered a new meditation technique that allows your body to fix itself. Dr. Dalbert Garnet, neuroscientist who had a spinal cord injury, who developed this technique that works in another dimension (the level of the unified field, as in [employer]), that allows you to receive new information, bring in energy, and get your body back into homeostasis." "That has made such a difference." "It teaches you how to elevate your mood instead of getting stuck in that negative mood." In less than a month, other people have already noticed a difference in her mood. Has finally finalized her divorce.  Went to mediation last week.  "My brain is still lagging, but the physical symptoms are much better. I feel awake, when I get up int he morning I'm ready to start the day." Has stopped using marijuana.  Intolerant to statins. Is taking Zetia. BP 130/80 or less.  Review of Systems No chest pain, SOB, HA, dizziness, vision change, N/V, diarrhea, constipation, dysuria, urinary urgency or frequency, myalgias, arthralgias or rash.   Patient Active Problem List   Diagnosis Date Noted  . Hypothyroidism 04/24/2017  . Moderate recurrent major depression ([place]) 04/15/2017  . 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 Diarrhea 01/02/2017  . Abnormal brain scan 09/06/2016  . HLD (hyperlipidemia) 02/20/2016  . HTN  (hypertension) 02/20/2016  . Anxiety and depression 02/20/2016  . ADD (attention deficit disorder) 02/20/2016  . Insomnia 02/20/2016  . Seasonal allergies 02/20/2016  . Peripheral neuropathy 02/20/2016  . PTSD (post-traumatic stress disorder) 09/07/2015  . MCI (mild cognitive impairment) 10/07/2014  . Sleep apnea 01/05/2006     Prior to Admission medications   Medication Sig Start Date End Date Taking? Authorizing Provider  aspirin 81 MG chewable tablet Chew 1 tablet (81 mg total) by mouth daily. 08/08/16  Yes Quintella Reichert, MD  atorvastatin (LIPITOR) 10 MG tablet Take 1 tablet (10 mg total) by mouth daily. 04/30/17  Yes Christyn Gutkowski, PA-C  busPIRone (BUSPAR) 30 MG tablet Take 1 tablet (30 mg total) by mouth 2 (two) times daily. 03/19/17  Yes Rae Plotner, PA-C  Cholecalciferol (VITAMIN D3) 5000 units TABS Take 10,000 Units by mouth daily with breakfast.    Yes [provider]  escitalopram (LEXAPRO) 10 MG tablet Take 1 tablet (10 mg total) by mouth daily. 03/19/17  Yes Conya Ellinwood, PA-C  ezetimibe (ZETIA) 10 MG tablet Take 1 tablet (10 mg total) by mouth at bedtime. 03/19/17  Yes Jeriann Sayres, PA-C  fexofenadine (ALLEGRA) 180 MG tablet Take 180 mg by mouth every morning.   Yes [provider]  glucosamine-chondroitin 500-400 MG tablet Take 1 tablet by mouth 2 (two) times daily.   Yes [provider]  ibuprofen (ADVIL,MOTRIN) 200 MG tablet Take 200-400 mg by mouth every 8 (eight) hours as needed (for pain.).   Yes [provider]  levothyroxine (SYNTHROID, LEVOTHROID) 125 MCG tablet  Take 1 tablet (125 mcg total) by mouth daily. 04/30/17  Yes Akari Crysler, PA-C  lisinopril-hydrochlorothiazide (PRINZIDE,ZESTORETIC) 20-25 MG tablet Take 1 tablet by mouth daily. 04/24/17  Yes Ivy Puryear, PA-C  Multiple Vitamin (MULTIVITAMIN) capsule Take 1 capsule by mouth daily.   Yes [provider]  NON FORMULARY Take 2,000 mg by mouth daily with  breakfast. MUCUNA PRURIENS 500 MG CAPSULES   Yes [provider]  Omega-3 Fatty Acids (FISH OIL) 500 MG CAPS Take 500 mg by mouth every evening.   Yes [provider]  OVER THE COUNTER MEDICATION Take 100 mg by mouth daily with breakfast. BACOPA FOR COGNITIVE & ENERGY FUNCTIONS   Yes [provider]  Probiotic Product (PROBIOTIC PO) Take 2 capsules by mouth daily. MEGASPORE PROBIOTICS   Yes [provider]  Sodium Fluoride (PREVIDENT 5000 DRY MOUTH DT) Place 1 application onto teeth daily.   Yes [provider]  zinc gluconate 50 MG tablet Take 50 mg by mouth at bedtime.    Yes [provider]     Allergies  Allergen Reactions  . Other     Seasonal allergies--runny nose, sneezing.       Objective:  Physical Exam  Constitutional: She is oriented to person, place, and time. She appears well-developed and well-nourished. She is active and cooperative. No distress.  BP 130/72 (BP Location: Right Arm, Patient Position: Sitting, Cuff Size: Normal)   Pulse 64   Temp 97.7 F (36.5 C) (Oral)   Resp 18   Ht 5\' 2"  (1.575 m)   Wt 116 lb 9.6 oz (52.9 kg)   SpO2 98%   BMI 21.33 kg/m   HENT:  Head: Normocephalic and atraumatic.  Right Ear: Hearing normal.  Left Ear: Hearing normal.  Eyes: Conjunctivae are normal. No scleral icterus.  Neck: Normal range of motion. Neck supple. No thyromegaly present.  Cardiovascular: Normal rate, regular rhythm and normal heart sounds.   Pulses:      Radial pulses are 2+ on the right side, and 2+ on the left side.  Pulmonary/Chest: Effort normal and breath sounds normal.  Lymphadenopathy:       Head (right side): No tonsillar, no preauricular, no posterior auricular and no occipital adenopathy present.       Head (left side): No tonsillar, no preauricular, no posterior auricular and no occipital adenopathy present.    She has no cervical adenopathy.       Right: No supraclavicular adenopathy present.        Left: No supraclavicular adenopathy present.  Neurological: She is alert and oriented to person, place, and time. No sensory deficit.  Skin: Skin is warm, dry and intact. No rash noted. No cyanosis or erythema. Nails show no clubbing.  Psychiatric: She has a normal mood and affect. Her speech is normal and behavior is normal. Judgment and thought content normal. Cognition and memory are normal.        Assessment & Plan:   Problem List Items Addressed This Visit    HLD (hyperlipidemia)    Uncontrolled at last check (last month). Is working on healthy lifestyle. Continue Zetia.      HTN (hypertension)    Controlled. COntinue lisinopril.      Low serum triiodothyronine (T3) - Primary    Has normalized with treatment, now levothyroxine. Await labs. Adjust regimen as indicated by results.       Relevant Orders   T3, Free (Completed)   Moderate recurrent major depression ([place])  Patient relates that this is now resolved. Using meditation has helped dramatically, as has finalizing her divorce. Will continue to monitor.       Other Visit Diagnoses    Low TSH level       Relevant Orders   TSH (Completed)   T4, free (Completed)   Need for influenza vaccination       Relevant Orders   Flu Vaccine QUAD 36+ mos IM (Completed)       Return in about 3 months (around 09/05/2017) for re-evalaution, fasting cholesterol level.   Fara Chute, PA-C [facility] [place]

## 2017-06-06 NOTE — Patient Instructions (Addendum)
Please call to schedule with Dr. Randa Evens [place].  [place], [place] [address]  [phone]      IF you received an x-ray today, you will receive an invoice from [facility]. Please contact [facility] at [phone] with questions or concerns regarding your invoice.   IF you received labwork today, you will receive an invoice from [facility]. Please contact [facility] at [phone] with questions or concerns regarding your invoice.   Our billing staff will not be able to assist you with questions regarding bills from these companies.  You will be contacted with the lab results as soon as they are available. The fastest way to get your results is to activate your My Chart account. Instructions are located on the last page of this paperwork. If you have not heard from [place] regarding the results in 2 weeks, please contact this office.

## 2017-06-07 LAB — T4, FREE: Free T4: 1.7 ng/dL (ref 0.82–1.77)

## 2017-06-07 LAB — T3, FREE: T3, Free: 2.6 pg/mL (ref 2.0–4.4)

## 2017-06-07 LAB — TSH: TSH: 0.137 u[IU]/mL — ABNORMAL LOW (ref 0.450–4.500)

## 2017-06-11 ENCOUNTER — Encounter: Payer: Self-pay | Admitting: Radiation Oncology

## 2017-06-11 ENCOUNTER — Other Ambulatory Visit: Payer: Self-pay

## 2017-06-11 ENCOUNTER — Ambulatory Visit
Admission: RE | Admit: 2017-06-11 | Discharge: 2017-06-11 | Disposition: A | Discharge status: Home / Self Care | Payer: Medicare Other | Source: Ambulatory Visit | Attending: Radiation Oncology | Admitting: Radiation Oncology

## 2017-06-11 VITALS — BP 142/77 | HR 97 | Temp 97.6°F | Wt 116.1 lb

## 2017-06-11 DIAGNOSIS — Z170 Estrogen receptor positive status [ER+]: Secondary | ICD-10-CM | POA: Diagnosis not present

## 2017-06-11 DIAGNOSIS — E890 Postprocedural hypothyroidism: Secondary | ICD-10-CM

## 2017-06-11 DIAGNOSIS — Z923 Personal history of irradiation: Secondary | ICD-10-CM | POA: Insufficient documentation

## 2017-06-11 DIAGNOSIS — D0512 Intraductal carcinoma in situ of left breast: Secondary | ICD-10-CM | POA: Diagnosis not present

## 2017-06-11 DIAGNOSIS — Z79810 Long term (current) use of selective estrogen receptor modulators (SERMs): Secondary | ICD-10-CM | POA: Insufficient documentation

## 2017-06-11 MED ORDER — LEVOTHYROXINE SODIUM 112 MCG PO TABS: 112.0000 ug | ORAL_TABLET | Freq: Every day | ORAL | 1 refills | Status: DC

## 2017-06-11 NOTE — Assessment & Plan Note (Signed)
Controlled. COntinue lisinopril.

## 2017-06-11 NOTE — Assessment & Plan Note (Signed)
Has normalized with treatment, now levothyroxine. Await labs. Adjust regimen as indicated by results.

## 2017-06-11 NOTE — Assessment & Plan Note (Signed)
Patient relates that this is now resolved. Using meditation has helped dramatically, as has finalizing her divorce. Will continue to monitor.

## 2017-06-11 NOTE — Assessment & Plan Note (Signed)
Uncontrolled at last check (last month). Is working on healthy lifestyle. Continue Zetia.

## 2017-06-11 NOTE — Progress Notes (Signed)
Radiation Oncology Follow up Note  Name: Angela Murphy   Date:   06/11/2017 MRN:  [id number] DOB: [birth year]    This 71 y.o. female presents to the clinic today for one-month follow-up status post whole breast radiation to her left breast for ductal carcinoma in situ. Tumor was ER/PR positive  REFERRING PROVIDER: Harrison Mons, PA-C  HPI: Patient is a. 71 year old female now out 1 month having completed whole breast radiation to her left breast for ductal carcinoma in situ ER/PR positive. Seen today in routine follow-up she is doing well she specifically denies breast tenderness cough or bone pain. She states she felt better than she has in a long time her stress levels are down she is meditating.  COMPLICATIONS OF TREATMENT: none  FOLLOW UP COMPLIANCE: keeps appointments   PHYSICAL EXAM:  BP (!) 142/77   Pulse 97   Temp 97.6 F (36.4 C)   Wt 116 lb 1.2 oz (52.7 kg)   BMI 21.23 kg/m  Lungs are clear to A&P cardiac examination essentially unremarkable with regular rate and rhythm. No dominant mass or nodularity is noted in either breast in 2 positions examined. Incision is well-healed. No axillary or supraclavicular adenopathy is appreciated. Cosmetic result is excellent. Well-developed well-nourished patient in NAD. HEENT reveals PERLA, EOMI, discs not visualized.  Oral cavity is clear. No oral mucosal lesions are identified. Neck is clear without evidence of cervical or supraclavicular adenopathy. Lungs are clear to A&P. Cardiac examination is essentially unremarkable with regular rate and rhythm without murmur rub or thrill. Abdomen is benign with no organomegaly or masses noted. Motor sensory and DTR levels are equal and symmetric in the upper and lower extremities. Cranial nerves II through XII are grossly intact. Proprioception is intact. No peripheral adenopathy or edema is identified. No motor or sensory levels are noted. Crude visual fields are within normal range.  RADIOLOGY  RESULTS: No current films for review  PLAN: Present time she is doing well I'm please were overall progress. I've asked to see her back in 3-4 months for follow-up. I've made her a follow-up appointment with Dr. Janese Banks for consideration of tamoxifen therapy. Patient knows to call with any concerns.  I would like to take this opportunity to thank you for allowing me to participate in the care of your patient.Armstead Peaks., MD

## 2017-06-13 ENCOUNTER — Ambulatory Visit (INDEPENDENT_AMBULATORY_CARE_PROVIDER_SITE_OTHER): Payer: Medicare Other | Admitting: Psychology

## 2017-06-13 DIAGNOSIS — F4323 Adjustment disorder with mixed anxiety and depressed mood: Secondary | ICD-10-CM

## 2017-06-13 DIAGNOSIS — F4310 Post-traumatic stress disorder, unspecified: Secondary | ICD-10-CM | POA: Diagnosis not present

## 2017-06-16 DIAGNOSIS — Z853 Personal history of malignant neoplasm of breast: Secondary | ICD-10-CM | POA: Diagnosis not present

## 2017-06-28 IMAGING — MG MM PLC BREAST LOC DEV 1ST LESION INC MAMMO GUIDE*L*
7 series · 7 of 7 positions shown · non-contrast
Comparison: Previous exams.

CLINICAL DATA: Recent diagnosis of ductal carcinoma in situ of the
left breast following stereotactic biopsy of calcifications in the
upper-outer quadrant.

EXAM:
NEEDLE LOCALIZATION OF THE LEFT BREAST WITH MAMMO GUIDANCE

[L CC (1 of 4)]
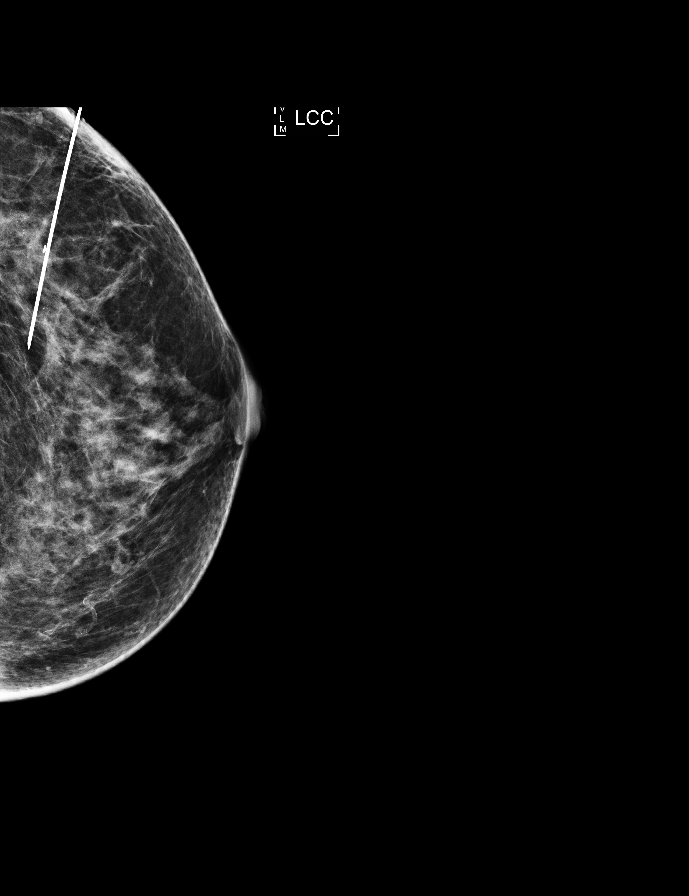

[L CC (2 of 4)]
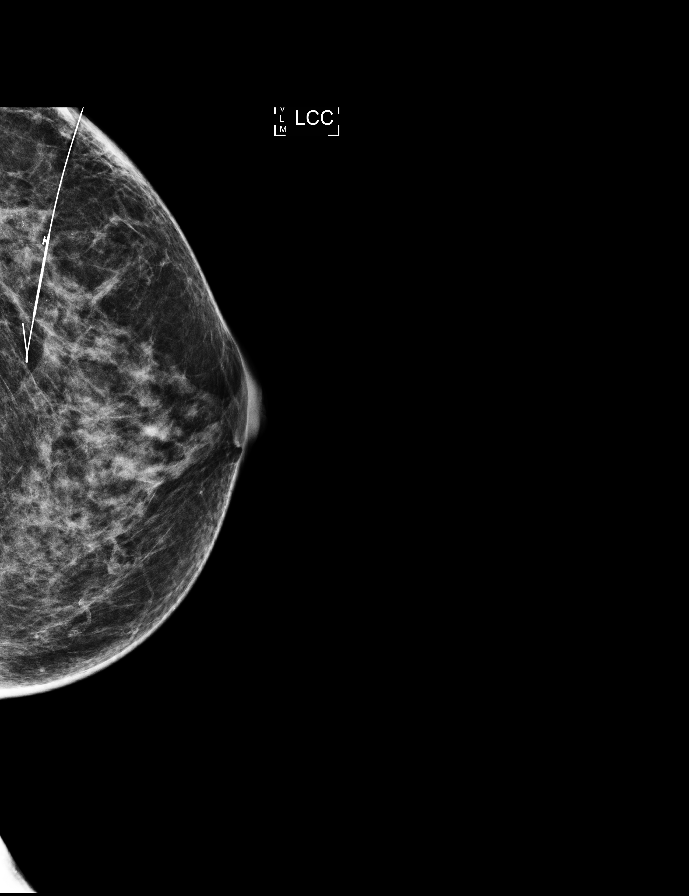

[L LM (1 of 3)]
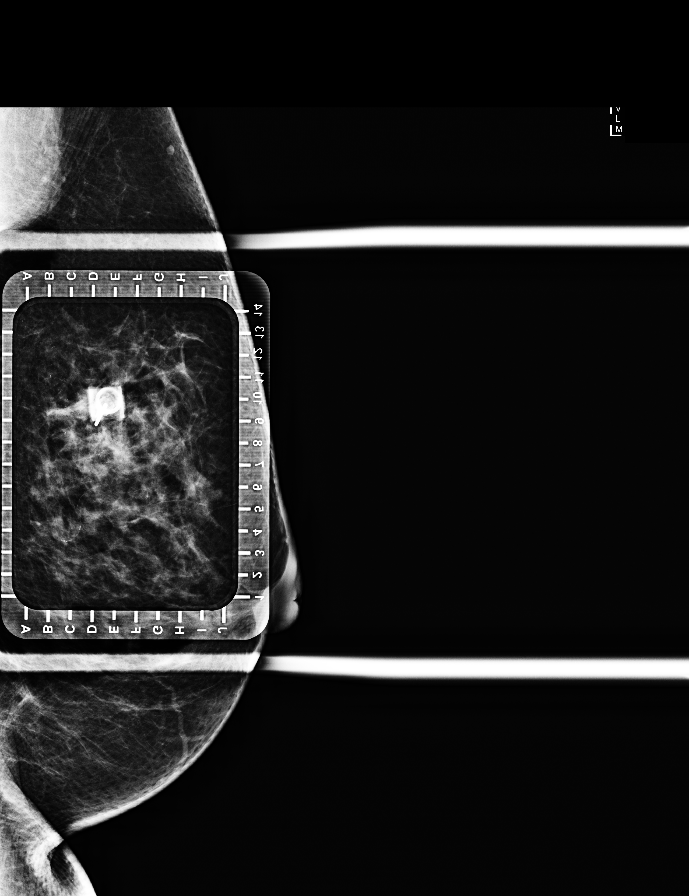

[L CC (3 of 4)]
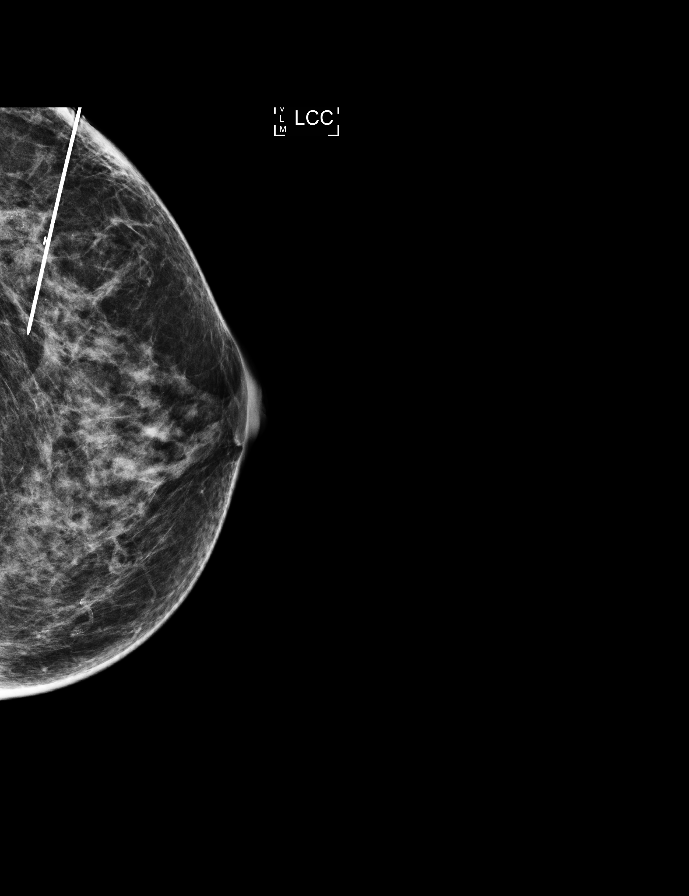

[L LM (2 of 3)]
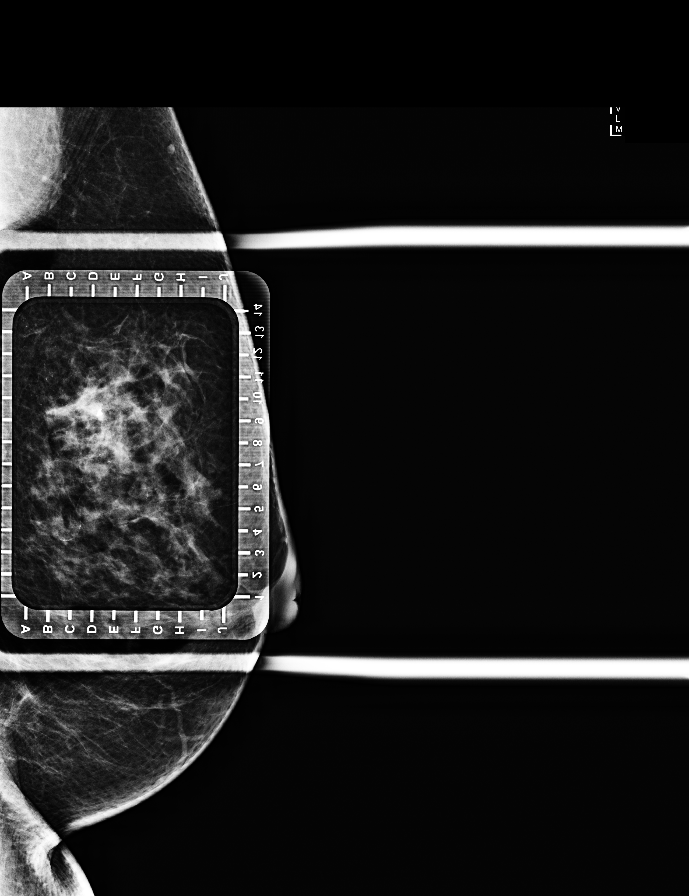

[L CC (4 of 4)]
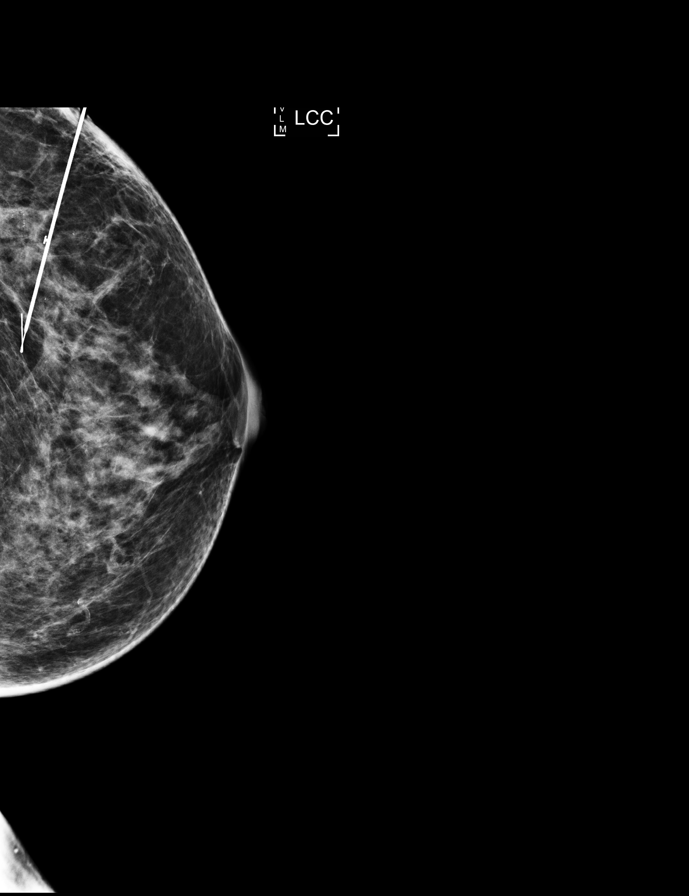

[L LM (3 of 3)]
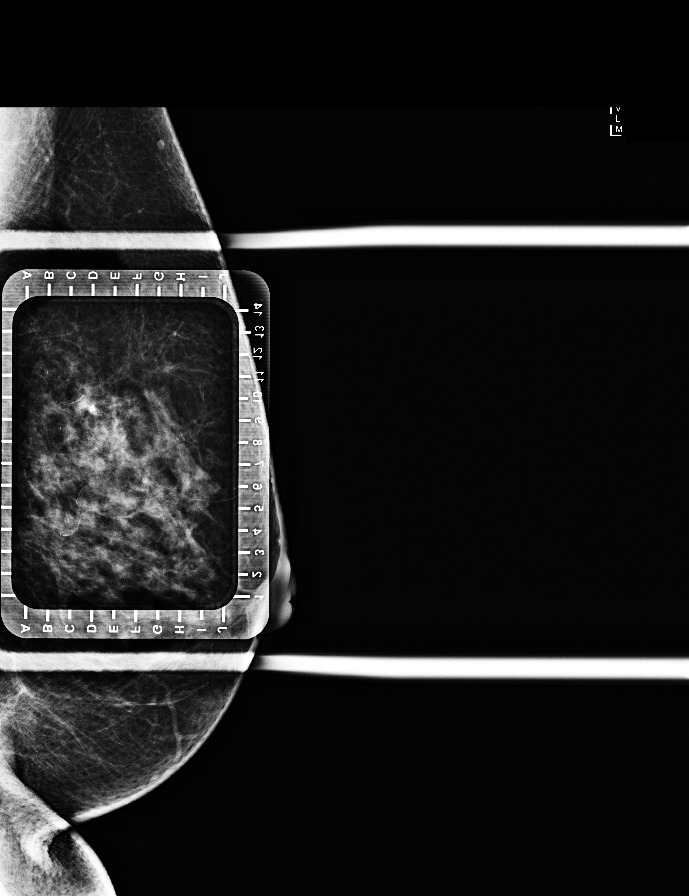

[7 of 7 positions shown; findings below may reference images not displayed]

FINDINGS: Patient presents for needle localization prior to lumpectomy. I met
with the patient and we discussed the procedure of needle
localization including benefits and alternatives. We discussed the
high likelihood of a successful procedure. We discussed the risks of
the procedure, including infection, bleeding, tissue injury, and
further surgery. Informed, written consent was given. The usual
time-out protocol was performed immediately prior to the procedure.

Using mammographic guidance, sterile technique, 1% lidocaine and a 7
cm modified Kopans needle, residual calcifications and a biopsy clip
were localized using lateral to medial approach. The images were
marked for Dr. Zay.
IMPRESSION: Needle localization left breast. No apparent complications.

## 2017-07-01 ENCOUNTER — Inpatient Hospital Stay: Payer: Medicare Other | Attending: Radiation Oncology | Admitting: Oncology

## 2017-07-01 ENCOUNTER — Other Ambulatory Visit: Payer: Self-pay | Admitting: *Deleted

## 2017-07-01 ENCOUNTER — Encounter: Payer: Self-pay | Admitting: Oncology

## 2017-07-01 VITALS — BP 146/76 | HR 96 | Temp 96.5°F | Resp 18 | Wt 118.8 lb

## 2017-07-01 DIAGNOSIS — F419 Anxiety disorder, unspecified: Secondary | ICD-10-CM | POA: Insufficient documentation

## 2017-07-01 DIAGNOSIS — E781 Pure hyperglyceridemia: Secondary | ICD-10-CM | POA: Diagnosis not present

## 2017-07-01 DIAGNOSIS — E039 Hypothyroidism, unspecified: Secondary | ICD-10-CM | POA: Insufficient documentation

## 2017-07-01 DIAGNOSIS — M8580 Other specified disorders of bone density and structure, unspecified site: Secondary | ICD-10-CM | POA: Diagnosis not present

## 2017-07-01 DIAGNOSIS — Z170 Estrogen receptor positive status [ER+]: Secondary | ICD-10-CM | POA: Diagnosis not present

## 2017-07-01 DIAGNOSIS — F909 Attention-deficit hyperactivity disorder, unspecified type: Secondary | ICD-10-CM | POA: Insufficient documentation

## 2017-07-01 DIAGNOSIS — I10 Essential (primary) hypertension: Secondary | ICD-10-CM | POA: Diagnosis not present

## 2017-07-01 DIAGNOSIS — Z9012 Acquired absence of left breast and nipple: Secondary | ICD-10-CM | POA: Diagnosis not present

## 2017-07-01 DIAGNOSIS — D0512 Intraductal carcinoma in situ of left breast: Secondary | ICD-10-CM | POA: Diagnosis not present

## 2017-07-01 DIAGNOSIS — Z79899 Other long term (current) drug therapy: Secondary | ICD-10-CM

## 2017-07-01 DIAGNOSIS — G8929 Other chronic pain: Secondary | ICD-10-CM | POA: Insufficient documentation

## 2017-07-01 DIAGNOSIS — M1990 Unspecified osteoarthritis, unspecified site: Secondary | ICD-10-CM | POA: Insufficient documentation

## 2017-07-01 MED ORDER — ANASTROZOLE 1 MG PO TABS: 1.0000 mg | ORAL_TABLET | Freq: Every day | ORAL | 3 refills | Status: DC
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
## 2017-07-01 NOTE — Progress Notes (Signed)
Here for follow up -doing very well she stated

## 2017-07-28 ENCOUNTER — Telehealth: Payer: Self-pay | Admitting: *Deleted

## 2017-07-28 NOTE — Telephone Encounter (Signed)
-----   Message from Festus Holts sent at 07/28/2017 12:06 PM EDT ----- Regarding: earlier time o 07/31/17 I called patient as requested to move up her appointment time on 07/31/17.  Per patient, she advised me that she hadn't started her oral meds and wanted to know if she should reshedule her follow up for another date. Do you still want her to come on 07/31/17 ?   Verdis Frederickson

## 2017-07-28 NOTE — Telephone Encounter (Signed)
Called pt and she tells me she is going to start medication this week and we agreed to start 07/31/2017. So 6 weeks from that date will be 09/11/17 but pt out of town that day and would like it to be 12/13 1:15 for labs and 1:30 to see md.  I will send message to schedulers and pt will look on my chart and call if she ahs questions or can't see it.

## 2017-07-29 ENCOUNTER — Other Ambulatory Visit: Payer: Self-pay | Admitting: *Deleted

## 2017-07-31 ENCOUNTER — Inpatient Hospital Stay: Payer: Medicare Other | Admitting: Oncology

## 2017-07-31 ENCOUNTER — Inpatient Hospital Stay: Payer: Medicare Other

## 2017-08-06 DIAGNOSIS — I781 Nevus, non-neoplastic: Secondary | ICD-10-CM | POA: Diagnosis not present

## 2017-08-08 ENCOUNTER — Ambulatory Visit (INDEPENDENT_AMBULATORY_CARE_PROVIDER_SITE_OTHER): Payer: Medicare Other | Admitting: Psychology

## 2017-08-08 ENCOUNTER — Encounter: Payer: Self-pay | Admitting: Physician Assistant

## 2017-08-08 DIAGNOSIS — F4323 Adjustment disorder with mixed anxiety and depressed mood: Secondary | ICD-10-CM | POA: Diagnosis not present

## 2017-08-08 DIAGNOSIS — F4310 Post-traumatic stress disorder, unspecified: Secondary | ICD-10-CM | POA: Diagnosis not present

## 2017-08-08 MED ORDER — BUPROPION HCL ER (XL) 300 MG PO TB24: 300.0000 mg | ORAL_TABLET | Freq: Every day | ORAL | 0 refills | Status: DC

## 2017-08-08 NOTE — Telephone Encounter (Signed)
Patient notified via My Chart.  Meds ordered this encounter  Medications  . buPROPion (WELLBUTRIN XL) 300 MG 24 hr tablet    Sig: Take 1 tablet (300 mg total) by mouth daily.    Dispense:  90 tablet    Refill:  0    Order Specific Question:   Supervising Provider    Answer:   Brigitte Pulse, EVA N [[id number]]

## 2017-08-11 DIAGNOSIS — M5134 Other intervertebral disc degeneration, thoracic region: Secondary | ICD-10-CM | POA: Diagnosis not present

## 2017-08-11 DIAGNOSIS — M9903 Segmental and somatic dysfunction of lumbar region: Secondary | ICD-10-CM | POA: Diagnosis not present

## 2017-08-11 DIAGNOSIS — M9902 Segmental and somatic dysfunction of thoracic region: Secondary | ICD-10-CM | POA: Diagnosis not present

## 2017-08-11 DIAGNOSIS — M5136 Other intervertebral disc degeneration, lumbar region: Secondary | ICD-10-CM | POA: Diagnosis not present

## 2017-08-12 DIAGNOSIS — M5136 Other intervertebral disc degeneration, lumbar region: Secondary | ICD-10-CM | POA: Diagnosis not present

## 2017-08-12 DIAGNOSIS — M9902 Segmental and somatic dysfunction of thoracic region: Secondary | ICD-10-CM | POA: Diagnosis not present

## 2017-08-12 DIAGNOSIS — M9903 Segmental and somatic dysfunction of lumbar region: Secondary | ICD-10-CM | POA: Diagnosis not present

## 2017-08-12 DIAGNOSIS — M5134 Other intervertebral disc degeneration, thoracic region: Secondary | ICD-10-CM | POA: Diagnosis not present

## 2017-08-14 DIAGNOSIS — M9903 Segmental and somatic dysfunction of lumbar region: Secondary | ICD-10-CM | POA: Diagnosis not present

## 2017-08-14 DIAGNOSIS — M9902 Segmental and somatic dysfunction of thoracic region: Secondary | ICD-10-CM | POA: Diagnosis not present

## 2017-08-14 DIAGNOSIS — M5134 Other intervertebral disc degeneration, thoracic region: Secondary | ICD-10-CM | POA: Diagnosis not present

## 2017-08-14 DIAGNOSIS — M5136 Other intervertebral disc degeneration, lumbar region: Secondary | ICD-10-CM | POA: Diagnosis not present

## 2017-08-18 DIAGNOSIS — M9902 Segmental and somatic dysfunction of thoracic region: Secondary | ICD-10-CM | POA: Diagnosis not present

## 2017-08-18 DIAGNOSIS — M9903 Segmental and somatic dysfunction of lumbar region: Secondary | ICD-10-CM | POA: Diagnosis not present

## 2017-08-18 DIAGNOSIS — M5134 Other intervertebral disc degeneration, thoracic region: Secondary | ICD-10-CM | POA: Diagnosis not present

## 2017-08-18 DIAGNOSIS — M5136 Other intervertebral disc degeneration, lumbar region: Secondary | ICD-10-CM | POA: Diagnosis not present

## 2017-08-20 DIAGNOSIS — M9903 Segmental and somatic dysfunction of lumbar region: Secondary | ICD-10-CM | POA: Diagnosis not present

## 2017-08-20 DIAGNOSIS — M5134 Other intervertebral disc degeneration, thoracic region: Secondary | ICD-10-CM | POA: Diagnosis not present

## 2017-08-20 DIAGNOSIS — M5136 Other intervertebral disc degeneration, lumbar region: Secondary | ICD-10-CM | POA: Diagnosis not present

## 2017-08-20 DIAGNOSIS — M9902 Segmental and somatic dysfunction of thoracic region: Secondary | ICD-10-CM | POA: Diagnosis not present

## 2017-08-21 ENCOUNTER — Other Ambulatory Visit: Payer: Self-pay | Admitting: Physician Assistant

## 2017-08-21 DIAGNOSIS — E890 Postprocedural hypothyroidism: Secondary | ICD-10-CM

## 2017-08-21 NOTE — Telephone Encounter (Signed)
Not sure from her note if she wants this refilled and at what dose since they adjusted her dose.

## 2017-08-22 ENCOUNTER — Ambulatory Visit: Payer: Medicare Other | Admitting: Psychology

## 2017-08-22 DIAGNOSIS — M9902 Segmental and somatic dysfunction of thoracic region: Secondary | ICD-10-CM | POA: Diagnosis not present

## 2017-08-22 DIAGNOSIS — M5134 Other intervertebral disc degeneration, thoracic region: Secondary | ICD-10-CM | POA: Diagnosis not present

## 2017-08-22 DIAGNOSIS — M5136 Other intervertebral disc degeneration, lumbar region: Secondary | ICD-10-CM | POA: Diagnosis not present

## 2017-08-22 DIAGNOSIS — M9903 Segmental and somatic dysfunction of lumbar region: Secondary | ICD-10-CM | POA: Diagnosis not present

## 2017-08-26 DIAGNOSIS — M9903 Segmental and somatic dysfunction of lumbar region: Secondary | ICD-10-CM | POA: Diagnosis not present

## 2017-08-26 DIAGNOSIS — M5136 Other intervertebral disc degeneration, lumbar region: Secondary | ICD-10-CM | POA: Diagnosis not present

## 2017-08-26 DIAGNOSIS — M9902 Segmental and somatic dysfunction of thoracic region: Secondary | ICD-10-CM | POA: Diagnosis not present

## 2017-08-26 DIAGNOSIS — M5134 Other intervertebral disc degeneration, thoracic region: Secondary | ICD-10-CM | POA: Diagnosis not present

## 2017-08-26 NOTE — Telephone Encounter (Signed)
Please advise 

## 2017-08-27 DIAGNOSIS — M5136 Other intervertebral disc degeneration, lumbar region: Secondary | ICD-10-CM | POA: Diagnosis not present

## 2017-08-27 DIAGNOSIS — M5134 Other intervertebral disc degeneration, thoracic region: Secondary | ICD-10-CM | POA: Diagnosis not present

## 2017-08-27 DIAGNOSIS — M9902 Segmental and somatic dysfunction of thoracic region: Secondary | ICD-10-CM | POA: Diagnosis not present

## 2017-08-27 DIAGNOSIS — M9903 Segmental and somatic dysfunction of lumbar region: Secondary | ICD-10-CM | POA: Diagnosis not present

## 2017-08-29 ENCOUNTER — Encounter: Payer: Self-pay | Admitting: Physician Assistant

## 2017-09-01 DIAGNOSIS — M9903 Segmental and somatic dysfunction of lumbar region: Secondary | ICD-10-CM | POA: Diagnosis not present

## 2017-09-01 DIAGNOSIS — M5136 Other intervertebral disc degeneration, lumbar region: Secondary | ICD-10-CM | POA: Diagnosis not present

## 2017-09-01 DIAGNOSIS — M9902 Segmental and somatic dysfunction of thoracic region: Secondary | ICD-10-CM | POA: Diagnosis not present

## 2017-09-01 DIAGNOSIS — M5134 Other intervertebral disc degeneration, thoracic region: Secondary | ICD-10-CM | POA: Diagnosis not present

## 2017-09-03 DIAGNOSIS — M5134 Other intervertebral disc degeneration, thoracic region: Secondary | ICD-10-CM | POA: Diagnosis not present

## 2017-09-03 DIAGNOSIS — M9902 Segmental and somatic dysfunction of thoracic region: Secondary | ICD-10-CM | POA: Diagnosis not present

## 2017-09-03 DIAGNOSIS — M9903 Segmental and somatic dysfunction of lumbar region: Secondary | ICD-10-CM | POA: Diagnosis not present

## 2017-09-03 DIAGNOSIS — M5136 Other intervertebral disc degeneration, lumbar region: Secondary | ICD-10-CM | POA: Diagnosis not present

## 2017-09-04 DIAGNOSIS — M5136 Other intervertebral disc degeneration, lumbar region: Secondary | ICD-10-CM | POA: Diagnosis not present

## 2017-09-04 DIAGNOSIS — M9902 Segmental and somatic dysfunction of thoracic region: Secondary | ICD-10-CM | POA: Diagnosis not present

## 2017-09-04 DIAGNOSIS — M5134 Other intervertebral disc degeneration, thoracic region: Secondary | ICD-10-CM | POA: Diagnosis not present

## 2017-09-04 DIAGNOSIS — M9903 Segmental and somatic dysfunction of lumbar region: Secondary | ICD-10-CM | POA: Diagnosis not present

## 2017-09-05 ENCOUNTER — Ambulatory Visit (INDEPENDENT_AMBULATORY_CARE_PROVIDER_SITE_OTHER): Payer: Medicare Other | Admitting: Physician Assistant

## 2017-09-05 ENCOUNTER — Encounter: Payer: Self-pay | Admitting: Physician Assistant

## 2017-09-05 ENCOUNTER — Ambulatory Visit: Payer: Medicare Other | Admitting: Psychology

## 2017-09-05 VITALS — BP 126/80 | HR 97 | Temp 97.8°F | Resp 18 | Ht 62.0 in | Wt 121.0 lb

## 2017-09-05 DIAGNOSIS — Z7189 Other specified counseling: Secondary | ICD-10-CM | POA: Diagnosis not present

## 2017-09-05 DIAGNOSIS — Z7184 Encounter for health counseling related to travel: Secondary | ICD-10-CM

## 2017-09-05 DIAGNOSIS — R7989 Other specified abnormal findings of blood chemistry: Secondary | ICD-10-CM | POA: Diagnosis not present

## 2017-09-05 DIAGNOSIS — E785 Hyperlipidemia, unspecified: Secondary | ICD-10-CM | POA: Diagnosis not present

## 2017-09-05 DIAGNOSIS — E890 Postprocedural hypothyroidism: Secondary | ICD-10-CM | POA: Diagnosis not present

## 2017-09-05 DIAGNOSIS — I10 Essential (primary) hypertension: Secondary | ICD-10-CM

## 2017-09-05 MED ORDER — TYPHOID VACCINE PO CPDR: 1.0000 | DELAYED_RELEASE_CAPSULE | ORAL | 0 refills | Status: DC

## 2017-09-05 NOTE — Progress Notes (Signed)
Patient ID: Angela Murphy, female    DOB: 06-11-1946, 71 y.o.   MRN: [id number]  PCP: Harrison Mons, PA-C  Chief Complaint  Patient presents with  . Hyperlipidemia    Pt not fasting. Lipid removed.  . Hypertension  . Follow-up    Subjective:   Presents for evaluation of hyperlipidemia and HTN, but is not fasting today.  Things are going well. Some stress at work today, in preparation for her upcoming time away from work. Has a partner and office manager now who can help.  Leaves 12/02 for [place]. [place] for 10 day vacation. Staying at a resort, all inclusive. Plans to leave the resort to see [place].  Anxiety is gone. No longer needing Adderall. Had a difficult time getting off it, even once she had gotten down to 5 mg. Felt herself crashing. Meditation was instrumental in her ability to stop it. Once she did that, her anxiety dramatically improved. Has stopped the escitalopram. Doing well on bupropion. Is scheduled to see psychiatrist in January. Meditating daily. Will be taking a seminar in meditation while in [place]. Divorce is final. 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 [employer].  Chiropractor in [place]. Really likes him. LEFT foot neuropathy. Doing fitness training at a new place, following her trainer, Fatima Blank.    Review of Systems  Constitutional: Negative.   HENT: Negative for sore throat.   Eyes: Negative for visual disturbance.  Respiratory: Negative for cough, chest tightness, shortness of breath and wheezing.   Cardiovascular: Negative for chest pain and palpitations.  Gastrointestinal: Negative for abdominal pain, diarrhea, nausea and vomiting.  Endocrine: Negative for cold intolerance, heat intolerance, polydipsia, polyphagia and polyuria.  Genitourinary: Negative for dysuria, frequency, hematuria and urgency.  Musculoskeletal: Negative for  arthralgias and myalgias.  Skin: Negative for rash.  Neurological: Negative for dizziness, weakness and headaches.  Psychiatric/Behavioral: Negative for decreased concentration. The patient is not nervous/anxious.      Immunization History  Administered Date(s) Administered  . Influenza,inj,Quad PF,6+ Mos 08/17/2015, 09/12/2016, 06/06/2017  . Influenza-Unspecified 06/29/2015  . Pneumococcal Conjugate-13 06/29/2015  . Pneumococcal Polysaccharide-23 01/02/2017  . Tdap 11/07/2003  . Zoster 12/15/2012     Patient Active Problem List   Diagnosis Date Noted  . Hypothyroidism 04/24/2017  . Moderate recurrent major depression ([place]) 04/15/2017  . 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 Diarrhea 01/02/2017  . Abnormal brain scan 09/06/2016  . HLD (hyperlipidemia) 02/20/2016  . HTN (hypertension) 02/20/2016  . Anxiety and depression 02/20/2016  . ADD (attention deficit disorder) 02/20/2016  . Insomnia 02/20/2016  . Seasonal allergies 02/20/2016  . Peripheral neuropathy 02/20/2016  . PTSD (post-traumatic stress disorder) 09/07/2015  . MCI (mild cognitive impairment) 10/07/2014  . Sleep apnea 01/05/2006     Prior to Admission medications   Medication Sig Start Date End Date Taking? Authorizing Provider  anastrozole (ARIMIDEX) 1 MG tablet Take 1 tablet (1 mg total) by mouth daily. 07/01/17  Yes Sindy Guadeloupe, MD  buPROPion (WELLBUTRIN XL) 300 MG 24 hr tablet Take 1 tablet (300 mg total) by mouth daily. 08/08/17  Yes Celest Reitz, PA-C  ezetimibe (ZETIA) 10 MG tablet Take 1 tablet (10 mg total) by mouth at bedtime. 03/19/17  Yes Tametria Aho, PA-C  fexofenadine (ALLEGRA) 180 MG tablet Take 180 mg by mouth every morning.   Yes [provider]  glucosamine-chondroitin  500-400 MG tablet Take 1 tablet by mouth 2 (two) times daily.   Yes [provider]  ibuprofen (ADVIL,MOTRIN) 200 MG tablet Take 200-400  mg by mouth every 8 (eight) hours as needed (for pain.).   Yes [provider]  levothyroxine (SYNTHROID, LEVOTHROID) 112 MCG tablet TAKE 1 TABLET BY MOUTH ONCE DAILY 08/26/17  Yes Jayvon Mounger, PA-C  lisinopril-hydrochlorothiazide (PRINZIDE,ZESTORETIC) 20-25 MG tablet Take 1 tablet by mouth daily. 04/24/17  Yes Evalyne Cortopassi, PA-C  Multiple Vitamin (MULTIVITAMIN) capsule Take 1 capsule by mouth daily.   Yes [provider]  Omega-3 Fatty Acids (FISH OIL) 500 MG CAPS Take 500 mg by mouth every evening.   Yes [provider]  Probiotic Product (PROBIOTIC PO) Take 2 capsules by mouth daily. MEGASPORE PROBIOTICS   Yes [provider]  Sodium Fluoride (PREVIDENT 5000 DRY MOUTH DT) Place 1 application onto teeth daily.   Yes [provider]  traZODone (DESYREL) 50 MG tablet Take 50 mg by mouth at bedtime as needed for sleep.    Yes [provider]  aspirin 81 MG chewable tablet Chew 1 tablet (81 mg total) by mouth daily. Patient not taking: Reported on 07/01/2017 08/08/16   Quintella Reichert, MD  busPIRone (BUSPAR) 30 MG tablet Take 1 tablet (30 mg total) by mouth 2 (two) times daily. Patient not taking: Reported on 09/05/2017 03/19/17   Harrison Mons, PA-C  Cholecalciferol (VITAMIN D3) 5000 units TABS Take 10,000 Units by mouth daily with breakfast.     [provider]  escitalopram (LEXAPRO) 10 MG tablet Take 1 tablet (10 mg total) by mouth daily. Patient not taking: Reported on 09/05/2017 03/19/17   Harrison Mons, PA-C  NON FORMULARY Take 2,000 mg by mouth daily with breakfast. MUCUNA PRURIENS 500 MG CAPSULES    [provider]  OVER THE COUNTER MEDICATION Take 100 mg by mouth daily with breakfast. BACOPA FOR COGNITIVE & ENERGY FUNCTIONS    [provider]     Allergies  Allergen Reactions  . Other     Seasonal allergies--runny nose, sneezing.  . Statins Other (See Comments)    Awful muscle weakness. Has tried  several, doesn't recall which.       Objective:  Physical Exam  Constitutional: She is oriented to person, place, and time. She appears well-developed and well-nourished. She is active and cooperative. No distress.  BP 126/80 (BP Location: Right Arm, Patient Position: Sitting, Cuff Size: Normal)   Pulse 97   Temp 97.8 F (36.6 C) (Oral)   Resp 18   Ht 5\' 2"  (1.575 m)   Wt 121 lb (54.9 kg)   SpO2 97%   BMI 22.13 kg/m   HENT:  Head: Normocephalic and atraumatic.  Right Ear: Hearing normal.  Left Ear: Hearing normal.  Eyes: Conjunctivae are normal. No scleral icterus.  Neck: Normal range of motion. Neck supple. No thyromegaly present.  Cardiovascular: Normal rate, regular rhythm and normal heart sounds.  Pulses:      Radial pulses are 2+ on the right side, and 2+ on the left side.  Pulmonary/Chest: Effort normal and breath sounds normal.  Lymphadenopathy:       Head (right side): No tonsillar, no preauricular, no posterior auricular and no occipital adenopathy present.       Head (left side): No tonsillar, no preauricular, no posterior auricular and no occipital adenopathy present.    She has no cervical adenopathy.       Right: No supraclavicular adenopathy present.  Left: No supraclavicular adenopathy present.  Neurological: She is alert and oriented to person, place, and time. No sensory deficit.  Skin: Skin is warm, dry and intact. No rash noted. No cyanosis or erythema. Nails show no clubbing.  Psychiatric: She has a normal mood and affect. Her speech is normal and behavior is normal.         Assessment & Plan:   Problem List Items Addressed This Visit    HLD (hyperlipidemia) - Primary    Deferred labs, as she is not fasting.      HTN (hypertension)    Controlled. Continue lisinopril.      Low serum triiodothyronine (T3)    Update labs to assess levothyroxine dose appropriateness.       Hypothyroidism    Other Visit Diagnoses    Low TSH level        Travel advice encounter       Relevant Medications   typhoid (VIVOTIF) DR capsule       Return for re-evalaution pending lab results.   Fara Chute, PA-C [facility]

## 2017-09-05 NOTE — Patient Instructions (Addendum)
Please check with your pharmacy (any pharmacy) about getting a tetanus (either Td or Tdap) before you leave.    IF you received an x-ray today, you will receive an invoice from [facility]. Please contact [facility] at [phone] with questions or concerns regarding your invoice.   IF you received labwork today, you will receive an invoice from [facility]. Please contact [facility] at [phone] with questions or concerns regarding your invoice.   Our billing staff will not be able to assist you with questions regarding bills from these companies.  You will be contacted with the lab results as soon as they are available. The fastest way to get your results is to activate your My Chart account. Instructions are located on the last page of this paperwork. If you have not heard from [place] regarding the results in 2 weeks, please contact this office.

## 2017-09-06 LAB — SPECIMEN STATUS

## 2017-09-07 NOTE — Assessment & Plan Note (Signed)
Controlled.  Continue lisinopril 

## 2017-09-07 NOTE — Assessment & Plan Note (Signed)
Deferred labs, as she is not fasting.

## 2017-09-07 NOTE — Assessment & Plan Note (Signed)
Update labs to assess levothyroxine dose appropriateness.

## 2017-09-08 LAB — T3, FREE: T3 FREE: 2.6 pg/mL (ref 2.0–4.4)

## 2017-09-08 LAB — T4, FREE: Free T4: 1.36 ng/dL (ref 0.82–1.77)

## 2017-09-08 LAB — TSH: TSH: 8.45 u[IU]/mL — AB (ref 0.450–4.500)

## 2017-09-17 ENCOUNTER — Other Ambulatory Visit: Payer: Self-pay | Admitting: Physician Assistant

## 2017-09-17 DIAGNOSIS — M5136 Other intervertebral disc degeneration, lumbar region: Secondary | ICD-10-CM | POA: Diagnosis not present

## 2017-09-17 DIAGNOSIS — M9902 Segmental and somatic dysfunction of thoracic region: Secondary | ICD-10-CM | POA: Diagnosis not present

## 2017-09-17 DIAGNOSIS — M9903 Segmental and somatic dysfunction of lumbar region: Secondary | ICD-10-CM | POA: Diagnosis not present

## 2017-09-17 DIAGNOSIS — M5134 Other intervertebral disc degeneration, thoracic region: Secondary | ICD-10-CM | POA: Diagnosis not present

## 2017-09-18 ENCOUNTER — Telehealth: Payer: Self-pay | Admitting: Physician Assistant

## 2017-09-18 ENCOUNTER — Inpatient Hospital Stay: Payer: Medicare Other

## 2017-09-18 ENCOUNTER — Encounter: Payer: Self-pay | Admitting: Oncology

## 2017-09-18 ENCOUNTER — Other Ambulatory Visit: Payer: Self-pay

## 2017-09-18 ENCOUNTER — Inpatient Hospital Stay: Payer: Medicare Other | Attending: Oncology | Admitting: Oncology

## 2017-09-18 VITALS — BP 141/91 | HR 81 | Temp 98.4°F | Resp 16 | Wt 125.0 lb

## 2017-09-18 DIAGNOSIS — D0512 Intraductal carcinoma in situ of left breast: Secondary | ICD-10-CM | POA: Diagnosis not present

## 2017-09-18 DIAGNOSIS — E781 Pure hyperglyceridemia: Secondary | ICD-10-CM | POA: Insufficient documentation

## 2017-09-18 DIAGNOSIS — M5134 Other intervertebral disc degeneration, thoracic region: Secondary | ICD-10-CM | POA: Diagnosis not present

## 2017-09-18 DIAGNOSIS — F419 Anxiety disorder, unspecified: Secondary | ICD-10-CM | POA: Diagnosis not present

## 2017-09-18 DIAGNOSIS — Z79811 Long term (current) use of aromatase inhibitors: Secondary | ICD-10-CM | POA: Diagnosis not present

## 2017-09-18 DIAGNOSIS — Z9841 Cataract extraction status, right eye: Secondary | ICD-10-CM | POA: Insufficient documentation

## 2017-09-18 DIAGNOSIS — Z9012 Acquired absence of left breast and nipple: Secondary | ICD-10-CM | POA: Diagnosis not present

## 2017-09-18 DIAGNOSIS — M8580 Other specified disorders of bone density and structure, unspecified site: Secondary | ICD-10-CM | POA: Insufficient documentation

## 2017-09-18 DIAGNOSIS — F329 Major depressive disorder, single episode, unspecified: Secondary | ICD-10-CM | POA: Insufficient documentation

## 2017-09-18 DIAGNOSIS — G709 Myoneural disorder, unspecified: Secondary | ICD-10-CM | POA: Insufficient documentation

## 2017-09-18 DIAGNOSIS — Z170 Estrogen receptor positive status [ER+]: Secondary | ICD-10-CM | POA: Insufficient documentation

## 2017-09-18 DIAGNOSIS — F4310 Post-traumatic stress disorder, unspecified: Secondary | ICD-10-CM | POA: Diagnosis not present

## 2017-09-18 DIAGNOSIS — M1990 Unspecified osteoarthritis, unspecified site: Secondary | ICD-10-CM | POA: Diagnosis not present

## 2017-09-18 DIAGNOSIS — G629 Polyneuropathy, unspecified: Secondary | ICD-10-CM | POA: Insufficient documentation

## 2017-09-18 DIAGNOSIS — M9902 Segmental and somatic dysfunction of thoracic region: Secondary | ICD-10-CM | POA: Diagnosis not present

## 2017-09-18 DIAGNOSIS — E039 Hypothyroidism, unspecified: Secondary | ICD-10-CM | POA: Diagnosis not present

## 2017-09-18 DIAGNOSIS — I10 Essential (primary) hypertension: Secondary | ICD-10-CM | POA: Insufficient documentation

## 2017-09-18 DIAGNOSIS — M9903 Segmental and somatic dysfunction of lumbar region: Secondary | ICD-10-CM | POA: Diagnosis not present

## 2017-09-18 DIAGNOSIS — F909 Attention-deficit hyperactivity disorder, unspecified type: Secondary | ICD-10-CM | POA: Insufficient documentation

## 2017-09-18 DIAGNOSIS — M5136 Other intervertebral disc degeneration, lumbar region: Secondary | ICD-10-CM | POA: Diagnosis not present

## 2017-09-18 DIAGNOSIS — Z923 Personal history of irradiation: Secondary | ICD-10-CM | POA: Diagnosis not present

## 2017-09-18 DIAGNOSIS — Z79899 Other long term (current) drug therapy: Secondary | ICD-10-CM | POA: Diagnosis not present

## 2017-09-18 LAB — CBC WITH DIFFERENTIAL/PLATELET
BASOS PCT: 1 %
Basophils Absolute: 0 10*3/uL (ref 0–0.1)
EOS ABS: 0.1 10*3/uL (ref 0–0.7)
Eosinophils Relative: 3 %
HCT: 37.6 % (ref 35.0–47.0)
HEMOGLOBIN: 12.7 g/dL (ref 12.0–16.0)
Lymphocytes Relative: 18 %
Lymphs Abs: 1 10*3/uL (ref 1.0–3.6)
MCH: 30.1 pg (ref 26.0–34.0)
MCHC: 33.9 g/dL (ref 32.0–36.0)
MCV: 89 fL (ref 80.0–100.0)
Monocytes Absolute: 0.7 10*3/uL (ref 0.2–0.9)
Monocytes Relative: 12 %
NEUTROS PCT: 66 %
Neutro Abs: 3.9 10*3/uL (ref 1.4–6.5)
Platelets: 205 10*3/uL (ref 150–440)
RBC: 4.23 MIL/uL (ref 3.80–5.20)
RDW: 12.9 % (ref 11.5–14.5)
WBC: 5.8 10*3/uL (ref 3.6–11.0)

## 2017-09-18 LAB — COMPREHENSIVE METABOLIC PANEL
ALT: 40 U/L (ref 14–54)
AST: 37 U/L (ref 15–41)
Albumin: 4.1 g/dL (ref 3.5–5.0)
Alkaline Phosphatase: 71 U/L (ref 38–126)
Anion gap: 9 (ref 5–15)
BUN: 21 mg/dL — ABNORMAL HIGH (ref 6–20)
CHLORIDE: 103 mmol/L (ref 101–111)
CO2: 27 mmol/L (ref 22–32)
CREATININE: 1.01 mg/dL — AB (ref 0.44–1.00)
Calcium: 9.7 mg/dL (ref 8.9–10.3)
GFR, EST NON AFRICAN AMERICAN: 55 mL/min — AB (ref >60)
Glucose, Bld: 115 mg/dL — ABNORMAL HIGH (ref 65–99)
Potassium: 3.5 mmol/L (ref 3.5–5.1)
Sodium: 139 mmol/L (ref 135–145)
Total Bilirubin: 0.5 mg/dL (ref 0.3–1.2)
Total Protein: 7.3 g/dL (ref 6.5–8.1)

## 2017-09-18 NOTE — Telephone Encounter (Signed)
Copied from [place]. Topic: Quick Communication - See Telephone Encounter >> Sep 18, 2017  2:32 PM Cleaster Corin, NT wrote: CRM for notification. See Telephone encounter for:   09/18/17. Dr. Nicolasa Ducking office called and wanted to know the most recent appt. That pt. Has seen Dr. Dellis Filbert and would also appt. Notes and med. List sent for upcoming appt. With Dr. Nicolasa Ducking.  Fax number [phone]

## 2017-09-18 NOTE — Progress Notes (Signed)
Patient here for follow up with labs today. She states that she is feeling very tired and fatigued since finishing her radiation. She denies having any pain at this time, but states she has some intermittent back pain and neuropathy in her feet.
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
## 2017-09-19 ENCOUNTER — Ambulatory Visit (INDEPENDENT_AMBULATORY_CARE_PROVIDER_SITE_OTHER): Payer: Medicare Other | Admitting: Psychology

## 2017-09-19 DIAGNOSIS — F4323 Adjustment disorder with mixed anxiety and depressed mood: Secondary | ICD-10-CM

## 2017-09-19 DIAGNOSIS — F4310 Post-traumatic stress disorder, unspecified: Secondary | ICD-10-CM

## 2017-09-19 NOTE — Telephone Encounter (Signed)
Faxed 09/19/17

## 2017-09-19 NOTE — Telephone Encounter (Signed)
Sent to medical records. 

## 2017-09-22 ENCOUNTER — Ambulatory Visit: Payer: Medicare Other

## 2017-09-22 ENCOUNTER — Telehealth: Payer: Self-pay | Admitting: *Deleted

## 2017-09-22 DIAGNOSIS — M9903 Segmental and somatic dysfunction of lumbar region: Secondary | ICD-10-CM | POA: Diagnosis not present

## 2017-09-22 DIAGNOSIS — M9902 Segmental and somatic dysfunction of thoracic region: Secondary | ICD-10-CM | POA: Diagnosis not present

## 2017-09-22 DIAGNOSIS — M5136 Other intervertebral disc degeneration, lumbar region: Secondary | ICD-10-CM | POA: Diagnosis not present

## 2017-09-22 DIAGNOSIS — M5134 Other intervertebral disc degeneration, thoracic region: Secondary | ICD-10-CM | POA: Diagnosis not present

## 2017-09-22 NOTE — Telephone Encounter (Signed)
Tried calling pt and voicemail is full.

## 2017-09-22 NOTE — Telephone Encounter (Signed)
-----   Message from [place] sent at 09/22/2017  3:11 PM EST ----- Regarding: anastrozoloe Contact: [phone] Pt does NOT want to take this RX-putting her into a depressing state/muscle-joint aches- another RX???  Stopped taking Dec 14-thx!

## 2017-09-25 ENCOUNTER — Telehealth: Payer: Self-pay | Admitting: *Deleted

## 2017-09-25 NOTE — Telephone Encounter (Signed)
Called pt again and her voicemail is full and no way to leave a message. Will cont. To try to call pt again. If patient answers then dr Janese Banks would like to offer her aromasin.

## 2017-09-25 NOTE — Telephone Encounter (Signed)
-----   Message from [place] sent at 09/22/2017  3:11 PM EST ----- Regarding: anastrozoloe Contact: [phone] Pt does NOT want to take this RX-putting her into a depressing state/muscle-joint aches- another RX???  Stopped taking Dec 14-thx!

## 2017-09-26 ENCOUNTER — Ambulatory Visit (HOSPITAL_COMMUNITY)
Admission: EM | Admit: 2017-09-26 | Discharge: 2017-09-26 | Disposition: A | Discharge status: Home / Self Care | Payer: Medicare Other | Attending: Internal Medicine | Admitting: Internal Medicine

## 2017-09-26 ENCOUNTER — Ambulatory Visit: Payer: Medicare Other | Admitting: Osteopathic Medicine

## 2017-09-26 ENCOUNTER — Encounter (HOSPITAL_COMMUNITY): Payer: Self-pay | Admitting: Emergency Medicine

## 2017-09-26 DIAGNOSIS — Z888 Allergy status to other drugs, medicaments and biological substances status: Secondary | ICD-10-CM | POA: Diagnosis not present

## 2017-09-26 DIAGNOSIS — F4310 Post-traumatic stress disorder, unspecified: Secondary | ICD-10-CM | POA: Insufficient documentation

## 2017-09-26 DIAGNOSIS — F329 Major depressive disorder, single episode, unspecified: Secondary | ICD-10-CM | POA: Insufficient documentation

## 2017-09-26 DIAGNOSIS — Z79899 Other long term (current) drug therapy: Secondary | ICD-10-CM | POA: Diagnosis not present

## 2017-09-26 DIAGNOSIS — Z9841 Cataract extraction status, right eye: Secondary | ICD-10-CM | POA: Diagnosis not present

## 2017-09-26 DIAGNOSIS — R197 Diarrhea, unspecified: Secondary | ICD-10-CM | POA: Insufficient documentation

## 2017-09-26 DIAGNOSIS — F909 Attention-deficit hyperactivity disorder, unspecified type: Secondary | ICD-10-CM | POA: Insufficient documentation

## 2017-09-26 DIAGNOSIS — Z791 Long term (current) use of non-steroidal anti-inflammatories (NSAID): Secondary | ICD-10-CM | POA: Diagnosis not present

## 2017-09-26 DIAGNOSIS — Z9889 Other specified postprocedural states: Secondary | ICD-10-CM | POA: Diagnosis not present

## 2017-09-26 DIAGNOSIS — E039 Hypothyroidism, unspecified: Secondary | ICD-10-CM | POA: Diagnosis not present

## 2017-09-26 DIAGNOSIS — J00 Acute nasopharyngitis [common cold]: Secondary | ICD-10-CM | POA: Diagnosis not present

## 2017-09-26 DIAGNOSIS — Z8261 Family history of arthritis: Secondary | ICD-10-CM | POA: Diagnosis not present

## 2017-09-26 DIAGNOSIS — I10 Essential (primary) hypertension: Secondary | ICD-10-CM | POA: Insufficient documentation

## 2017-09-26 DIAGNOSIS — H9203 Otalgia, bilateral: Secondary | ICD-10-CM | POA: Insufficient documentation

## 2017-09-26 DIAGNOSIS — R51 Headache: Secondary | ICD-10-CM | POA: Insufficient documentation

## 2017-09-26 DIAGNOSIS — Z8249 Family history of ischemic heart disease and other diseases of the circulatory system: Secondary | ICD-10-CM | POA: Insufficient documentation

## 2017-09-26 DIAGNOSIS — G4730 Sleep apnea, unspecified: Secondary | ICD-10-CM | POA: Insufficient documentation

## 2017-09-26 DIAGNOSIS — E785 Hyperlipidemia, unspecified: Secondary | ICD-10-CM | POA: Diagnosis not present

## 2017-09-26 LAB — POCT RAPID STREP A: STREPTOCOCCUS, GROUP A SCREEN (DIRECT): NEGATIVE

## 2017-09-26 MED ORDER — BENZONATATE 100 MG PO CAPS: 100.0000 mg | ORAL_CAPSULE | Freq: Three times a day (TID) | ORAL | 0 refills | Status: DC

## 2017-09-26 MED ORDER — CETIRIZINE HCL 5 MG PO TABS: 5.0000 mg | ORAL_TABLET | Freq: Every day | ORAL | 0 refills | Status: DC

## 2017-09-26 MED ORDER — FLUTICASONE PROPIONATE 50 MCG/ACT NA SUSP: 2.0000 | Freq: Every day | NASAL | 0 refills | Status: DC

## 2017-09-26 NOTE — ED Triage Notes (Signed)
PT C/O: cold sx associated w/sneezing, ST, bilateral ear pain, dull HA, dry cough, fatigue, chills  ONSET: 2-3 days  DENIES: fevers  TAKING MEDS: OTC cold meds   A&O x4... NAD... Ambulatory

## 2017-09-26 NOTE — ED Provider Notes (Signed)
[place]    CSN: [id number] Arrival date & time: 09/26/17  1217     History   Chief Complaint Chief Complaint  Patient presents with  . URI    HPI Angela Murphy is a 71 y.o. female.   71 year old female comes in for 2-3-day history of URI symptoms.  She has been experiencing sneezing, sore throat, bilateral ear pain, nonproductive cough, dull headache. Denies fevers, though has had some intermittent chills. otc cold medications with some improvement. No sick contact. Never smoker.  Patient would like to be tested for strep throat.  Patient with history of tonsillectomy.      Past Medical History:  Diagnosis Date  . ADHD   . Allergy   . Anemia   . Anxiety   . Arthritis   . Breast cancer ([place])   . Cancer ([place]) 12/2016   LEFT breast, DCIS  . Cataract   . Chicken pox   . Cognitive decline   . Depression   . Hypertension   . Hypertriglyceridemia   . Hypothyroidism   . Neuromuscular disorder ([place])    peripheral neuropathy  . Neuropathy   . Post traumatic stress disorder   . Sleep apnea    uses cpap  . Thyroid disease     Patient Active Problem List   Diagnosis Date Noted  . Hypothyroidism 04/24/2017  . Moderate recurrent major depression ([place]) 04/15/2017  . 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 Diarrhea 01/02/2017  . Abnormal brain scan 09/06/2016  . HLD (hyperlipidemia) 02/20/2016  . HTN (hypertension) 02/20/2016  . Anxiety and depression 02/20/2016  . Insomnia 02/20/2016  . Seasonal allergies 02/20/2016  . Peripheral neuropathy 02/20/2016  . PTSD (post-traumatic stress disorder) 09/07/2015  . MCI (mild cognitive impairment) 10/07/2014  . Sleep apnea 01/05/2006    Past Surgical History:  Procedure Laterality Date  . BREAST BIOPSY Left 12/25/2016   DCIS   . BREAST EXCISIONAL BIOPSY Left 02/07/2017   lumpectomy DCIS   . CATARACT EXTRACTION W/ INTRAOCULAR LENS   IMPLANT, BILATERAL    . EYE SURGERY Bilateral   . MASTECTOMY, PARTIAL Left 02/27/2017   Procedure: MASTECTOMY PARTIAL;  Surgeon: Leonie Green, MD;  Location: [facility] ORS;  Service: General;  Laterality: Left;  . PARTIAL MASTECTOMY WITH NEEDLE LOCALIZATION Left 02/07/2017   Procedure: PARTIAL MASTECTOMY WITH NEEDLE LOCALIZATION;  Surgeon: Leonie Green, MD;  Location: [facility] ORS;  Service: General;  Laterality: Left;  . SENTINEL NODE BIOPSY Left 02/07/2017   Procedure: SENTINEL NODE BIOPSY;  Surgeon: Leonie Green, MD;  Location: [facility] ORS;  Service: General;  Laterality: Left;  . THYROIDECTOMY, PARTIAL  1974  . TONSILLECTOMY      OB History    No data available       Home Medications    Prior to Admission medications   Medication Sig Start Date End Date Taking? Authorizing Provider  buPROPion (WELLBUTRIN XL) 300 MG 24 hr tablet TAKE 1 TABLET BY MOUTH ONCE DAILY 09/17/17  Yes Jeffery, Chelle, PA-C  ezetimibe (ZETIA) 10 MG tablet Take 1 tablet (10 mg total) by mouth at bedtime. 03/19/17  Yes Jeffery, Chelle, PA-C  fexofenadine (ALLEGRA) 180 MG tablet Take 180 mg by mouth every morning.   Yes [provider]  glucosamine-chondroitin 500-400 MG tablet Take 1 tablet by mouth 2 (two) times daily.   Yes [provider]  levothyroxine (SYNTHROID, LEVOTHROID) 112 MCG tablet TAKE 1 TABLET BY  MOUTH ONCE DAILY 08/26/17  Yes Jeffery, Chelle, PA-C  lisinopril-hydrochlorothiazide (PRINZIDE,ZESTORETIC) 20-25 MG tablet Take 1 tablet by mouth daily. 04/24/17  Yes Jeffery, Chelle, PA-C  Multiple Vitamin (MULTIVITAMIN) capsule Take 1 capsule by mouth daily.   Yes [provider]  Omega-3 Fatty Acids (FISH OIL) 500 MG CAPS Take 500 mg by mouth every evening.   Yes [provider]  Probiotic Product (PROBIOTIC PO) Take 2 capsules by mouth daily. MEGASPORE PROBIOTICS   Yes [provider]  benzonatate (TESSALON) 100 MG capsule Take 1 capsule (100 mg  total) by mouth every 8 (eight) hours. 09/26/17   Tasia Catchings, Ariyan Brisendine V, PA-C  cetirizine (ZYRTEC) 5 MG tablet Take 1 tablet (5 mg total) by mouth daily. 09/26/17   Tasia Catchings, Shalicia Craghead V, PA-C  fluticasone (FLONASE) 50 MCG/ACT nasal spray Place 2 sprays into both nostrils daily. 09/26/17   Tasia Catchings, Ernest Orr V, PA-C  ibuprofen (ADVIL,MOTRIN) 200 MG tablet Take 200-400 mg by mouth every 8 (eight) hours as needed (for pain.).    [provider]    Family History Family History  Problem Relation Age of Onset  . Arthritis Mother   . Heart disease Mother   . Heart disease Father   . Mental illness Maternal Uncle   . Arthritis Maternal Grandmother   . Heart disease Maternal Grandmother   . Alcohol abuse Maternal Grandfather   . Heart disease Maternal Grandfather   . Heart disease Paternal Grandmother   . Heart disease Paternal Grandfather     Social History Social History   Tobacco Use  . Smoking status: Never Smoker  . Smokeless tobacco: Never Used  Substance Use Topics  . Alcohol use: Yes    Alcohol/week: 0.0 oz    Comment: rarely  . Drug use: No    Comment: quit marijuana 04/2017     Allergies   Other and Statins   Review of Systems Review of Systems  Reason unable to perform ROS: See HPI as above.     Physical Exam Triage Vital Signs ED Triage Vitals  Enc Vitals Group     BP 09/26/17 1257 124/69     Pulse Rate 09/26/17 1257 91     Resp 09/26/17 1257 20     Temp 09/26/17 1257 98.8 F (37.1 C)     Temp Source 09/26/17 1257 Oral     SpO2 09/26/17 1257 99 %     Weight --      Height --      Head Circumference --      Peak Flow --      Pain Score 09/26/17 1254 4     Pain Loc --      Pain Edu? --      Excl. in [place]? --    No data found.  Updated Vital Signs BP 124/69 (BP Location: Left Arm)   Pulse 91   Temp 98.8 F (37.1 C) (Oral)   Resp 20   SpO2 99%   Physical Exam  Constitutional: She is oriented to person, place, and time. She appears well-developed and well-nourished. No  distress.  HENT:  Head: Normocephalic and atraumatic.  Right Ear: Tympanic membrane, external ear and ear canal normal. Tympanic membrane is not erythematous and not bulging.  Left Ear: Tympanic membrane, external ear and ear canal normal. Tympanic membrane is not erythematous and not bulging.  Nose: Right sinus exhibits maxillary sinus tenderness and frontal sinus tenderness. Left sinus exhibits maxillary sinus tenderness and frontal sinus tenderness.  Mouth/Throat:  Uvula is midline, oropharynx is clear and moist and mucous membranes are normal.  Eyes: Conjunctivae are normal. Pupils are equal, round, and reactive to light.  Neck: Normal range of motion. Neck supple.  Cardiovascular: Normal rate, regular rhythm and normal heart sounds. Exam reveals no gallop and no friction rub.  No murmur heard. Pulmonary/Chest: Effort normal and breath sounds normal. She has no decreased breath sounds. She has no wheezes. She has no rhonchi. She has no rales.  Lymphadenopathy:    She has no cervical adenopathy.  Neurological: She is alert and oriented to person, place, and time.  Skin: Skin is warm and dry.  Psychiatric: She has a normal mood and affect. Her behavior is normal. Judgment normal.     UC Treatments / Results  Labs (all labs ordered are listed, but only abnormal results are displayed) Labs Reviewed  CULTURE, GROUP A STREP [facility])  POCT RAPID STREP A    EKG  EKG Interpretation None       Radiology No results found.  Procedures Procedures (including critical care time)  Medications Ordered in UC Medications - No data to display   Initial Impression / Assessment and Plan / UC Course  I have reviewed the triage vital signs and the nursing notes.  Pertinent labs & imaging results that were available during my care of the patient were reviewed by me and considered in my medical decision making (see chart for details).    Negative strep. Discussed with patient history and  exam most consistent with viral URI. Symptomatic treatment as needed. Push fluids. Return precautions given.   Final Clinical Impressions(s) / UC Diagnoses   Final diagnoses:  Acute nasopharyngitis    ED Discharge Orders        Ordered    fluticasone (FLONASE) 50 MCG/ACT nasal spray  Daily     09/26/17 1401    cetirizine (ZYRTEC) 5 MG tablet  Daily     09/26/17 1401    benzonatate (TESSALON) 100 MG capsule  Every 8 hours     09/26/17 1401        Ok Edwards, [place] 09/26/17 1410

## 2017-09-26 NOTE — Discharge Instructions (Signed)
Rapid strep negative. Symptoms are most likely due to viral illness. Tessalon for cough. Start flonase, zyrtec-D for nasal congestion. You can use over the counter nasal saline rinse such as neti pot for nasal congestion. Keep hydrated, your urine should be clear to pale yellow in color. Tylenol/motrin for fever and pain. Monitor for any worsening of symptoms, chest pain, shortness of breath, wheezing, swelling of the throat, follow up for reevaluation.   For sore throat try using a honey-based tea. Use 3 teaspoons of honey with juice squeezed from half lemon. Place shaved pieces of ginger into 1/2-1 cup of water and warm over stove top. Then mix the ingredients and repeat every 4 hours as needed.

## 2017-09-28 LAB — CULTURE, GROUP A STREP (THRC)

## 2017-10-08 DIAGNOSIS — F4311 Post-traumatic stress disorder, acute: Secondary | ICD-10-CM | POA: Diagnosis not present

## 2017-10-08 DIAGNOSIS — F1211 Cannabis abuse, in remission: Secondary | ICD-10-CM | POA: Diagnosis not present

## 2017-10-08 DIAGNOSIS — F411 Generalized anxiety disorder: Secondary | ICD-10-CM | POA: Diagnosis not present

## 2017-10-08 DIAGNOSIS — F331 Major depressive disorder, recurrent, moderate: Secondary | ICD-10-CM | POA: Diagnosis not present

## 2017-10-08 DIAGNOSIS — G4701 Insomnia due to medical condition: Secondary | ICD-10-CM | POA: Diagnosis not present

## 2017-10-10 ENCOUNTER — Ambulatory Visit (INDEPENDENT_AMBULATORY_CARE_PROVIDER_SITE_OTHER): Payer: Medicare Other

## 2017-10-10 VITALS — BP 110/70 | HR 80 | Temp 98.2°F | Ht 62.0 in | Wt 123.4 lb

## 2017-10-10 DIAGNOSIS — Z0000 Encounter for general adult medical examination without abnormal findings: Secondary | ICD-10-CM

## 2017-10-10 NOTE — Patient Instructions (Addendum)
Angela Murphy , Thank you for taking time to come for your Medicare Wellness Visit. I appreciate your ongoing commitment to your health goals. Please review the following plan we discussed and let me know if I can assist you in the future.   Screening recommendations/referrals: Colonoscopy: You state you have a Cologuard kit at home and will complete this soon.  Mammogram: up to date, next due 12/12/2018 Bone Density: up to date, next due 11/12/2021 Recommended yearly ophthalmology/optometry visit for glaucoma screening and checkup Recommended yearly dental visit for hygiene and checkup  Vaccinations: Influenza vaccine: up to date Pneumococcal vaccine: up to date Tdap vaccine: up to date, You state you received this vaccine at the pharmacy 09/05/17. Please have your pharmacy send [place] the report.  Shingles vaccine: Check with your pharmacy about receiving the Shingrix vaccine    Advanced directives: Please bring a copy of your POA (Power of [place]) and/or Living Will to your next appointment.   Conditions/risks identified: Try to start back exercising on a more consistent basis.  Next appointment: schedule follow up with PCP, next Medicare Wellness visit is 10/13/2018 @ 8:20 am with Nurse Health Advisor.     Preventive Care 36 Years and Older, Female Preventive care refers to lifestyle choices and visits with your health care provider that can promote health and wellness. What does preventive care include?  A yearly physical exam. This is also called an annual well check.  Dental exams once or twice a year.  Routine eye exams. Ask your health care provider how often you should have your eyes checked.  Personal lifestyle choices, including:  Daily care of your teeth and gums.  Regular physical activity.  Eating a healthy diet.  Avoiding tobacco and drug use.  Limiting alcohol use.  Practicing safe sex.  Taking low-dose aspirin every day.  Taking vitamin and mineral  supplements as recommended by your health care provider. What happens during an annual well check? The services and screenings done by your health care provider during your annual well check will depend on your age, overall health, lifestyle risk factors, and family history of disease. Counseling  Your health care provider may ask you questions about your:  Alcohol use.  Tobacco use.  Drug use.  Emotional well-being.  Home and relationship well-being.  Sexual activity.  Eating habits.  History of falls.  Memory and ability to understand (cognition).  Work and work Statistician.  Reproductive health. Screening  You may have the following tests or measurements:  Height, weight, and BMI.  Blood pressure.  Lipid and cholesterol levels. These may be checked every 5 years, or more frequently if you are over 51 years old.  Skin check.  Lung cancer screening. You may have this screening every year starting at age 43 if you have a 30-pack-year history of smoking and currently smoke or have quit within the past 15 years.  Fecal occult blood test (FOBT) of the stool. You may have this test every year starting at age 73.  Flexible sigmoidoscopy or colonoscopy. You may have a sigmoidoscopy every 5 years or a colonoscopy every 10 years starting at age 61.  Hepatitis C blood test.  Hepatitis B blood test.  Sexually transmitted disease (STD) testing.  Diabetes screening. This is done by checking your blood sugar (glucose) after you have not eaten for a while (fasting). You may have this done every 1-3 years.  Bone density scan. This is done to screen for osteoporosis. You may have this done  starting at age 70.  Mammogram. This may be done every 1-2 years. Talk to your health care provider about how often you should have regular mammograms. Talk with your health care provider about your test results, treatment options, and if necessary, the need for more tests. Vaccines  Your  health care provider may recommend certain vaccines, such as:  Influenza vaccine. This is recommended every year.  Tetanus, diphtheria, and acellular pertussis (Tdap, Td) vaccine. You may need a Td booster every 10 years.  Zoster vaccine. You may need this after age 82.  Pneumococcal 13-valent conjugate (PCV13) vaccine. One dose is recommended after age 23.  Pneumococcal polysaccharide (PPSV23) vaccine. One dose is recommended after age 45. Talk to your health care provider about which screenings and vaccines you need and how often you need them. This information is not intended to replace advice given to you by your health care provider. Make sure you discuss any questions you have with your health care provider. Document Released: 10/20/2015 Document Revised: 06/12/2016 Document Reviewed: 07/25/2015 Elsevier Interactive Patient Education  2017 [place] Prevention in the Home Falls can cause injuries. They can happen to people of all ages. There are many things you can do to make your home safe and to help prevent falls. What can I do on the outside of my home?  Regularly fix the edges of walkways and driveways and fix any cracks.  Remove anything that might make you trip as you walk through a door, such as a raised step or threshold.  Trim any bushes or trees on the path to your home.  Use bright outdoor lighting.  Clear any walking paths of anything that might make someone trip, such as rocks or tools.  Regularly check to see if handrails are loose or broken. Make sure that both sides of any steps have handrails.  Any raised decks and porches should have guardrails on the edges.  Have any leaves, snow, or ice cleared regularly.  Use sand or salt on walking paths during winter.  Clean up any spills in your garage right away. This includes oil or grease spills. What can I do in the bathroom?  Use night lights.  Install grab bars by the toilet and in the tub and  shower. Do not use towel bars as grab bars.  Use non-skid mats or decals in the tub or shower.  If you need to sit down in the shower, use a plastic, non-slip stool.  Keep the floor dry. Clean up any water that spills on the floor as soon as it happens.  Remove soap buildup in the tub or shower regularly.  Attach bath mats securely with double-sided non-slip rug tape.  Do not have throw rugs and other things on the floor that can make you trip. What can I do in the bedroom?  Use night lights.  Make sure that you have a light by your bed that is easy to reach.  Do not use any sheets or blankets that are too big for your bed. They should not hang down onto the floor.  Have a firm chair that has side arms. You can use this for support while you get dressed.  Do not have throw rugs and other things on the floor that can make you trip. What can I do in the kitchen?  Clean up any spills right away.  Avoid walking on wet floors.  Keep items that you use a lot in easy-to-reach places.  If you need to reach something above you, use a strong step stool that has a grab bar.  Keep electrical cords out of the way.  Do not use floor polish or wax that makes floors slippery. If you must use wax, use non-skid floor wax.  Do not have throw rugs and other things on the floor that can make you trip. What can I do with my stairs?  Do not leave any items on the stairs.  Make sure that there are handrails on both sides of the stairs and use them. Fix handrails that are broken or loose. Make sure that handrails are as long as the stairways.  Check any carpeting to make sure that it is firmly attached to the stairs. Fix any carpet that is loose or worn.  Avoid having throw rugs at the top or bottom of the stairs. If you do have throw rugs, attach them to the floor with carpet tape.  Make sure that you have a light switch at the top of the stairs and the bottom of the stairs. If you do not  have them, ask someone to add them for you. What else can I do to help prevent falls?  Wear shoes that:  Do not have high heels.  Have rubber bottoms.  Are comfortable and fit you well.  Are closed at the toe. Do not wear sandals.  If you use a stepladder:  Make sure that it is fully opened. Do not climb a closed stepladder.  Make sure that both sides of the stepladder are locked into place.  Ask someone to hold it for you, if possible.  Clearly mark and make sure that you can see:  Any grab bars or handrails.  First and last steps.  Where the edge of each step is.  Use tools that help you move around (mobility aids) if they are needed. These include:  Canes.  Walkers.  Scooters.  Crutches.  Turn on the lights when you go into a dark area. Replace any light bulbs as soon as they burn out.  Set up your furniture so you have a clear path. Avoid moving your furniture around.  If any of your floors are uneven, fix them.  If there are any pets around you, be aware of where they are.  Review your medicines with your doctor. Some medicines can make you feel dizzy. This can increase your chance of falling. Ask your doctor what other things that you can do to help prevent falls. This information is not intended to replace advice given to you by your health care provider. Make sure you discuss any questions you have with your health care provider. Document Released: 07/20/2009 Document Revised: 02/29/2016 Document Reviewed: 10/28/2014 Elsevier Interactive Patient Education  2017 [employer].
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
## 2017-10-13 NOTE — Progress Notes (Signed)
[facility] Consultation      Assessment and Plan:  Obstructive sleep apnea. -Known obstructive sleep apnea, first diagnosed-Subsequently switched to BiPAP and 2011, has been on this machine since then -We'll place an order for new Supplies.  Essential hypertension. -Sleep apnea can worsen, hypertension, there were discussed importance of adequately treating her sleep apnea to help control her hypertension.  Date: 10/13/2017  MRN# [id number] Angela Murphy 05-21-1946    Angela Murphy is a 72 y.o. old female seen in consultation for chief complaint of:    Chief Complaint  Patient presents with  . Sleep Apnea    Pt DME:[facility] she wishes to change she is not pleased with services. SHe is doing well on cpap therapy average 72-6 hrs nightly.    HPI:   Patient is a 72 year old female with a known history of obstructive sleep apnea. She was diagnosed  in 2002, initially treated with a dental device, started on Bipap in 2011. She was seen here due to machine problems and a new one was ordered.   Since that time she has been started on CPAP auto, with pressure range 5-10.  Download data 30 days as of 10/13/17.  30/30 days average usage of 6 hours 27 minutes, pressure is 5-10.  Residual AHI is 5.3.95th percentile pressure is 6.   DATA: 06/25/16 CPAP titration: BiPAP 10/5 recommended 12/06-01/04 compliance study: Usage days 30/30. > 4 hrs 28/30.   Social Hx:   Social History   Tobacco Use  . Smoking status: Never Smoker  . Smokeless tobacco: Never Used  Substance Use Topics  . Alcohol use: No    Alcohol/week: 0.0 oz    Frequency: Never    Comment: rarely  . Drug use: No    Comment: quit marijuana 04/2017   Medication:   Medications reviewed, include Adderall, buspirone, Allegra, trazodone.   Allergies:  Other and Statins  Review of Systems: Gen:  Denies  fever, sweats, chills HEENT: Denies blurred vision, double vision. Bleeds. Cvc:  No dizziness,  chest pain. Resp:   Denies cough or sputum production. Gi: Denies swallowing difficulty, stomach pain. Gu:  Denies bladder incontinence, burning urine Ext:   No Joint pain, stiffness. Skin: No skin rash,  hives  Endoc:  No polyuria, polydipsia. Psych: No depression, insomnia. Other:  All other systems were reviewed with the patient and were negative other that what is mentioned in the HPI.   Physical Examination:   VS: BP 112/62 (BP Location: Right Arm, Cuff Size: Normal)   Pulse 82   Resp 16   Ht 5\' 2"  (1.575 m)   Wt 127 lb (57.6 kg)   SpO2 97%   BMI 23.23 kg/m   General Appearance: No distress  Neuro:without focal findings,  speech normal,  HEENT: PERRLA, EOM intact.   Pulmonary: normal breath sounds, No wheezing.  CardiovascularNormal S1,S2.  No m/r/g.   Abdomen: Benign, Soft, non-tender. Renal:  No costovertebral tenderness  GU:  No performed at this time. Endoc: No evident thyromegaly, no signs of acromegaly. Skin:   warm, no rashes, no ecchymosis  Extremities: normal, no cyanosis, clubbing.  Other findings:    LABORATORY PANEL:   CBC No results for input(s): WBC, HGB, HCT, PLT in the last 168 hours. ------------------------------------------------------------------------------------------------------------------  Chemistries  No results for input(s): NA, K, CL, CO2, GLUCOSE, BUN, CREATININE, CALCIUM, MG, AST, ALT, ALKPHOS, BILITOT in the last 168 hours.  Invalid input(s): GFRCGP ------------------------------------------------------------------------------------------------------------------  Cardiac Enzymes No results for input(s): TROPONINI in  the last 168 hours. ------------------------------------------------------------  RADIOLOGY:  No results found.     Thank  you for the consultation and for allowing [facility] to assist in the care of your patient. Our recommendations are noted above.  Please contact us if we can be of  further service.   Marda Stalker, MD.  Board Certified in Internal Medicine, Pulmonary Medicine, [place], and Sleep Medicine.  [facility] Office Number: [phone]  Patricia Pesa, M.D.  Merton Border, M.D  10/13/2017

## 2017-10-14 ENCOUNTER — Ambulatory Visit (INDEPENDENT_AMBULATORY_CARE_PROVIDER_SITE_OTHER): Payer: Medicare Other | Admitting: Internal Medicine

## 2017-10-14 ENCOUNTER — Encounter: Payer: Self-pay | Admitting: Internal Medicine

## 2017-10-14 VITALS — BP 112/62 | HR 82 | Resp 16 | Ht 62.0 in | Wt 127.0 lb

## 2017-10-14 DIAGNOSIS — G4733 Obstructive sleep apnea (adult) (pediatric): Secondary | ICD-10-CM | POA: Diagnosis not present

## 2017-10-14 NOTE — Patient Instructions (Signed)
--  Doing well with you CPAP, continue using every night!

## 2017-10-15 ENCOUNTER — Telehealth: Payer: Self-pay | Admitting: *Deleted

## 2017-10-15 NOTE — Telephone Encounter (Signed)
-----   Message from [place] sent at 09/22/2017  3:11 PM EST ----- Regarding: anastrozoloe Contact: [phone] Pt does NOT want to take this RX-putting her into a depressing state/muscle-joint aches- another RX???  Stopped taking Dec 14-thx!

## 2017-10-15 NOTE — Telephone Encounter (Signed)
I called at first and was hung up before I can speak ; I think she just hit wrong button. Tried back again and the phone was busy. Then waited a few minutes and the phone rang and voicemail came on and I left message that I had tried to get in touch with her several times but her voicemail was full and I could not leave a message. I asked if she could call me so we can talk about changing to another med for her. I left my name and nuber

## 2017-10-17 ENCOUNTER — Ambulatory Visit (INDEPENDENT_AMBULATORY_CARE_PROVIDER_SITE_OTHER): Payer: Medicare Other | Admitting: Psychology

## 2017-10-17 DIAGNOSIS — F4310 Post-traumatic stress disorder, unspecified: Secondary | ICD-10-CM

## 2017-10-17 DIAGNOSIS — F4323 Adjustment disorder with mixed anxiety and depressed mood: Secondary | ICD-10-CM

## 2017-10-19 ENCOUNTER — Encounter (HOSPITAL_COMMUNITY): Payer: Self-pay | Admitting: Emergency Medicine

## 2017-10-19 ENCOUNTER — Other Ambulatory Visit: Payer: Self-pay

## 2017-10-19 ENCOUNTER — Emergency Department (HOSPITAL_COMMUNITY): Payer: Medicare Other

## 2017-10-19 ENCOUNTER — Emergency Department (HOSPITAL_COMMUNITY)
Admission: EM | Admit: 2017-10-19 | Discharge: 2017-10-20 | Disposition: A | Discharge status: Home / Self Care | Payer: Medicare Other | Attending: Emergency Medicine | Admitting: Emergency Medicine

## 2017-10-19 DIAGNOSIS — R748 Abnormal levels of other serum enzymes: Secondary | ICD-10-CM | POA: Insufficient documentation

## 2017-10-19 DIAGNOSIS — Z853 Personal history of malignant neoplasm of breast: Secondary | ICD-10-CM | POA: Diagnosis not present

## 2017-10-19 DIAGNOSIS — E785 Hyperlipidemia, unspecified: Secondary | ICD-10-CM | POA: Diagnosis not present

## 2017-10-19 DIAGNOSIS — F909 Attention-deficit hyperactivity disorder, unspecified type: Secondary | ICD-10-CM | POA: Diagnosis not present

## 2017-10-19 DIAGNOSIS — D649 Anemia, unspecified: Secondary | ICD-10-CM

## 2017-10-19 DIAGNOSIS — K8020 Calculus of gallbladder without cholecystitis without obstruction: Secondary | ICD-10-CM | POA: Insufficient documentation

## 2017-10-19 DIAGNOSIS — R079 Chest pain, unspecified: Secondary | ICD-10-CM | POA: Diagnosis not present

## 2017-10-19 DIAGNOSIS — I10 Essential (primary) hypertension: Secondary | ICD-10-CM | POA: Diagnosis not present

## 2017-10-19 DIAGNOSIS — Z79899 Other long term (current) drug therapy: Secondary | ICD-10-CM | POA: Insufficient documentation

## 2017-10-19 DIAGNOSIS — R740 Nonspecific elevation of levels of transaminase and lactic acid dehydrogenase [LDH]: Secondary | ICD-10-CM | POA: Diagnosis not present

## 2017-10-19 DIAGNOSIS — E039 Hypothyroidism, unspecified: Secondary | ICD-10-CM | POA: Diagnosis not present

## 2017-10-19 DIAGNOSIS — R0789 Other chest pain: Secondary | ICD-10-CM | POA: Diagnosis not present

## 2017-10-19 DIAGNOSIS — R7401 Elevation of levels of liver transaminase levels: Secondary | ICD-10-CM

## 2017-10-19 LAB — BASIC METABOLIC PANEL
Anion gap: 11 (ref 5–15)
BUN: 22 mg/dL — AB (ref 6–20)
CHLORIDE: 99 mmol/L — AB (ref 101–111)
CO2: 26 mmol/L (ref 22–32)
Calcium: 9.4 mg/dL (ref 8.9–10.3)
Creatinine, Ser: 0.98 mg/dL (ref 0.44–1.00)
GFR calc Af Amer: >60 mL/min (ref >60)
GFR calc non Af Amer: 57 mL/min — ABNORMAL LOW (ref >60)
GLUCOSE: 116 mg/dL — AB (ref 65–99)
POTASSIUM: 3.5 mmol/L (ref 3.5–5.1)
Sodium: 136 mmol/L (ref 135–145)

## 2017-10-19 LAB — I-STAT TROPONIN, ED: Troponin i, poc: 0 ng/mL (ref 0.00–0.08)

## 2017-10-19 LAB — HEPATIC FUNCTION PANEL
ALK PHOS: 73 U/L (ref 38–126)
ALT: 49 U/L (ref 14–54)
AST: 78 U/L — ABNORMAL HIGH (ref 15–41)
Albumin: 4.2 g/dL (ref 3.5–5.0)
BILIRUBIN TOTAL: 0.6 mg/dL (ref 0.3–1.2)
Bilirubin, Direct: <0.1 mg/dL — ABNORMAL LOW (ref 0.1–0.5)
TOTAL PROTEIN: 6.8 g/dL (ref 6.5–8.1)

## 2017-10-19 LAB — LIPASE, BLOOD: Lipase: 63 U/L — ABNORMAL HIGH (ref 11–51)

## 2017-10-19 LAB — CBC
HEMATOCRIT: 34.6 % — AB (ref 36.0–46.0)
Hemoglobin: 11.7 g/dL — ABNORMAL LOW (ref 12.0–15.0)
MCH: 29.6 pg (ref 26.0–34.0)
MCHC: 33.8 g/dL (ref 30.0–36.0)
MCV: 87.6 fL (ref 78.0–100.0)
Platelets: 204 10*3/uL (ref 150–400)
RBC: 3.95 MIL/uL (ref 3.87–5.11)
RDW: 12.5 % (ref 11.5–15.5)
WBC: 4.4 10*3/uL (ref 4.0–10.5)

## 2017-10-19 NOTE — ED Notes (Signed)
EDP at bedside  

## 2017-10-19 NOTE — ED Triage Notes (Signed)
Brought by ems form home for c/o chest pressure across chest below breasts that started around 7pm.  Reports she ate vietnamese food around 5pm and thought it was related.  C/o nausea and vomited X 1 after inducing vomiting.  Reports some relief at first but it didn't last long.  Given Asa 324mg  and Ntg X1 en route.  Reports pain went from a 7/10 to 5/10 after ntg.

## 2017-10-20 ENCOUNTER — Emergency Department (HOSPITAL_COMMUNITY): Payer: Medicare Other

## 2017-10-20 DIAGNOSIS — K8020 Calculus of gallbladder without cholecystitis without obstruction: Secondary | ICD-10-CM | POA: Diagnosis not present

## 2017-10-20 DIAGNOSIS — R079 Chest pain, unspecified: Secondary | ICD-10-CM | POA: Diagnosis not present

## 2017-10-20 LAB — I-STAT TROPONIN, ED: TROPONIN I, POC: 0 ng/mL (ref 0.00–0.08)

## 2017-10-20 NOTE — ED Notes (Signed)
EDP at bedside  

## 2017-10-20 NOTE — ED Provider Notes (Signed)
Patient initially seen and evaluated by Dr. Alvino Chapel, signed out to me pending repeat troponin and abdominal ultrasound.  Repeat troponin is normal.  Abdominal ultrasound shows cholelithiasis without cholecystitis.  Patient has not had no further pain while in the ED.  I reviewed her labs and there is trivial elevation of lipase which is probably not clinically significant.  Mild elevation of AST could conceivably be related to cholelithiasis.  Mild anemia is also noted.  Patient is advised of these findings.  She is to follow-up with PCP regarding her chest pain, but is referred to general surgery for evaluation of her cholelithiasis.  Advised to stay on a low-fat diet.  Return precautions discussed.   Delora Fuel, MD 00/76/22 [phone]

## 2017-10-20 NOTE — Discharge Instructions (Addendum)
Stay on a low fat diet.   Return if symptoms are getting worse.

## 2017-10-20 NOTE — ED Provider Notes (Signed)
[facility] EMERGENCY DEPARTMENT Provider Note   CSN: [id number] Arrival date & time: 10/19/17  2124     History   Chief Complaint Chief Complaint  Patient presents with  . Chest Pain    HPI Angela Murphy is a 72 y.o. female.  HPI Patient presents after an episode of chest pain.  Around 7:00 today she had lower chest pain.  A tightness in her lower chest and upper mid abdomen.  Centered mostly in her epigastric area.  Had one episode of nausea and vomiting after she made herself vomit for the nausea.  It was not continued relief.  EMS gave her aspirin and nitroglycerin.  Pain improved now.  Now mostly pain-free.  Has not had episodes like this before.  No change in stools.  No episodes of abdominal or chest pain after eating before this.  No trouble breathing.  No previous cardiac workup.  No cardiac history in her and no early cardiac history in her family. Past Medical History:  Diagnosis Date  . ADHD   . Allergy   . Anemia   . Anxiety   . Arthritis   . Breast cancer ([place])   . 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 Cataract   . Chicken pox   . Cognitive decline   . Depression   . Hypertension   . Hypertriglyceridemia   . Hypothyroidism   . Neuromuscular disorder ([place])    peripheral neuropathy  . Neuropathy   . Post traumatic stress disorder   . Sleep apnea    uses cpap  . Thyroid disease     Patient Active Problem List   Diagnosis Date Noted  . Hypothyroidism 04/24/2017  . 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 Diarrhea 01/02/2017  . Abnormal brain scan 09/06/2016  . HLD (hyperlipidemia) 02/20/2016  . HTN (hypertension) 02/20/2016  . Anxiety and depression 02/20/2016  . Insomnia 02/20/2016  . Seasonal allergies 02/20/2016  . Peripheral neuropathy 02/20/2016  . PTSD (post-traumatic stress disorder) 09/07/2015   . MCI (mild cognitive impairment) 10/07/2014  . Sleep apnea 01/05/2006    Past Surgical History:  Procedure Laterality Date  . BREAST BIOPSY Left 12/25/2016   DCIS   . BREAST EXCISIONAL BIOPSY Left 02/07/2017   lumpectomy DCIS   . CATARACT EXTRACTION W/ INTRAOCULAR LENS  IMPLANT, BILATERAL    . EYE SURGERY Bilateral   . MASTECTOMY, PARTIAL Left 02/27/2017   Procedure: MASTECTOMY PARTIAL;  Surgeon: Leonie Green, MD;  Location: [facility] ORS;  Service: General;  Laterality: Left;  . PARTIAL MASTECTOMY WITH NEEDLE LOCALIZATION Left 02/07/2017   Procedure: PARTIAL MASTECTOMY WITH NEEDLE LOCALIZATION;  Surgeon: Leonie Green, MD;  Location: [facility] ORS;  Service: General;  Laterality: Left;  . SENTINEL NODE BIOPSY Left 02/07/2017   Procedure: SENTINEL NODE BIOPSY;  Surgeon: Leonie Green, MD;  Location: [facility] ORS;  Service: General;  Laterality: Left;  . THYROIDECTOMY, PARTIAL  1974  . TONSILLECTOMY      OB History    No data available       Home Medications    Prior to Admission medications   Medication Sig Start Date End Date Taking? Authorizing Provider  buPROPion (WELLBUTRIN XL) 300 MG 24 hr tablet TAKE 1 TABLET BY MOUTH ONCE DAILY 09/17/17   Harrison Mons, PA-C  DULoxetine (CYMBALTA) 20 MG capsule Take 40 mg by mouth daily.  [provider]  ezetimibe (ZETIA) 10 MG tablet Take 1 tablet (10 mg total) by mouth at bedtime. 03/19/17   Harrison Mons, PA-C  fexofenadine (ALLEGRA) 180 MG tablet Take 180 mg by mouth every morning.    [provider]  fluticasone (FLONASE) 50 MCG/ACT nasal spray Place 2 sprays into both nostrils daily. 09/26/17   Ok Edwards, PA-C  glucosamine-chondroitin 500-400 MG tablet Take 1 tablet by mouth 2 (two) times daily.    [provider]  ibuprofen (ADVIL,MOTRIN) 200 MG tablet Take 200-400 mg by mouth every 8 (eight) hours as needed (for pain.).    [provider]  levothyroxine (SYNTHROID, LEVOTHROID) 112  MCG tablet TAKE 1 TABLET BY MOUTH ONCE DAILY 08/26/17   Harrison Mons, PA-C  lisinopril-hydrochlorothiazide (PRINZIDE,ZESTORETIC) 20-25 MG tablet Take 1 tablet by mouth daily. 04/24/17   Harrison Mons, PA-C  Multiple Vitamin (MULTIVITAMIN) capsule Take 1 capsule by mouth daily.    [provider]  Omega-3 Fatty Acids (FISH OIL) 500 MG CAPS Take 500 mg by mouth every evening.    [provider]  Probiotic Product (PROBIOTIC PO) Take 2 capsules by mouth daily. MEGASPORE PROBIOTICS    [provider]    Family History Family History  Problem Relation Age of Onset  . Arthritis Mother   . Heart disease Mother   . Heart disease Father   . Mental illness Maternal Uncle   . Arthritis Maternal Grandmother   . Heart disease Maternal Grandmother   . Alcohol abuse Maternal Grandfather   . Heart disease Maternal Grandfather   . Heart disease Paternal Grandmother   . Heart disease Paternal Grandfather     Social History Social History   Tobacco Use  . Smoking status: Never Smoker  . Smokeless tobacco: Never Used  Substance Use Topics  . Alcohol use: No    Alcohol/week: 0.0 oz    Frequency: Never    Comment: rarely  . Drug use: No    Comment: quit marijuana 04/2017     Allergies   Other and Statins   Review of Systems Review of Systems  Constitutional: Negative for appetite change.  HENT: Negative for congestion.   Respiratory: Negative for shortness of breath.   Cardiovascular: Positive for chest pain.  Gastrointestinal: Positive for abdominal pain and nausea.  Genitourinary: Negative for difficulty urinating.  Musculoskeletal: Negative for back pain.  Skin: Negative for rash.  Neurological: Negative for numbness.  Hematological: Negative for adenopathy.  Psychiatric/Behavioral: Negative for confusion.     Physical Exam Updated Vital Signs BP 125/68   Pulse 71   Temp 98.3 F (36.8 C) (Oral)   Resp 11   Ht 5\' 2"  (1.575 m)   Wt 55.3 kg  (122 lb)   SpO2 99%   BMI 22.31 kg/m   Physical Exam  Constitutional: She appears well-developed.  HENT:  Head: Atraumatic.  Cardiovascular: Normal rate.  Pulmonary/Chest: Breath sounds normal.  Abdominal: There is no tenderness.  Musculoskeletal:       Right lower leg: She exhibits no edema.  Neurological: She is alert.  Skin: Skin is warm. Capillary refill takes less than 2 seconds.     ED Treatments / Results  Labs (all labs ordered are listed, but only abnormal results are displayed) Labs Reviewed  BASIC METABOLIC PANEL - Abnormal; Notable for the following components:      Result Value   Chloride 99 (*)    Glucose, Bld 116 (*)    BUN 22 (*)  GFR calc non Af Amer 57 (*)    All other components within normal limits  CBC - Abnormal; Notable for the following components:   Hemoglobin 11.7 (*)    HCT 34.6 (*)    All other components within normal limits  HEPATIC FUNCTION PANEL - Abnormal; Notable for the following components:   AST 78 (*)    Bilirubin, Direct <0.1 (*)    All other components within normal limits  LIPASE, BLOOD - Abnormal; Notable for the following components:   Lipase 63 (*)    All other components within normal limits  I-STAT TROPONIN, ED  I-STAT TROPONIN, ED    EKG  EKG Interpretation  Date/Time:  Sunday October 19 2017 21:17:33 EST Ventricular Rate:  58 PR Interval:  166 QRS Duration: 82 QT Interval:  436 QTC Calculation: 428 R Axis:   -5 Text Interpretation:  Sinus bradycardia Possible Anterior infarct , age undetermined Abnormal ECG No significant change since last tracing Reconfirmed by Davonna Belling [phone]) on 10/19/2017 9:52:45 PM       Radiology Dg Chest 2 View  Result Date: 10/19/2017 CLINICAL DATA:  Chest pain EXAM: CHEST  2 VIEW COMPARISON:  08/08/2016 chest radiograph. FINDINGS: Stable cardiomediastinal silhouette with normal heart size. No pneumothorax. No pleural effusion. Lungs appear clear, with no acute  consolidative airspace disease and no pulmonary edema. IMPRESSION: No active cardiopulmonary disease. Electronically Signed   By: Ilona Sorrel M.D.   On: 10/19/2017 22:22    Procedures Procedures (including critical care time)  Medications Ordered in ED Medications - No data to display   Initial Impression / Assessment and Plan / ED Course  I have reviewed the triage vital signs and the nursing notes.  Pertinent labs & imaging results that were available during my care of the patient were reviewed by me and considered in my medical decision making (see chart for details).     Patient with chest pain.  EKG reassuring.  Will get ultra troponin and likely cleared from a cardiac standpoint for follow-up at that time.  However lipase is mildly elevated has history of gallstones.  Ultrasound pending.  Care turned over to Dr. Roxanne Mins.  Final Clinical Impressions(s) / ED Diagnoses   Final diagnoses:  Nonspecific chest pain    ED Discharge Orders    None       Davonna Belling, MD 10/20/17 [phone]

## 2017-10-25 DIAGNOSIS — F411 Generalized anxiety disorder: Secondary | ICD-10-CM | POA: Diagnosis not present

## 2017-10-25 DIAGNOSIS — F331 Major depressive disorder, recurrent, moderate: Secondary | ICD-10-CM | POA: Diagnosis not present

## 2017-10-25 DIAGNOSIS — F4311 Post-traumatic stress disorder, acute: Secondary | ICD-10-CM | POA: Diagnosis not present

## 2017-10-25 DIAGNOSIS — F1211 Cannabis abuse, in remission: Secondary | ICD-10-CM | POA: Diagnosis not present

## 2017-10-25 DIAGNOSIS — G4701 Insomnia due to medical condition: Secondary | ICD-10-CM | POA: Diagnosis not present

## 2017-10-31 ENCOUNTER — Ambulatory Visit (INDEPENDENT_AMBULATORY_CARE_PROVIDER_SITE_OTHER): Payer: Medicare Other | Admitting: Psychology

## 2017-10-31 DIAGNOSIS — F410 Panic disorder [episodic paroxysmal anxiety] without agoraphobia: Secondary | ICD-10-CM | POA: Diagnosis not present

## 2017-10-31 DIAGNOSIS — F4323 Adjustment disorder with mixed anxiety and depressed mood: Secondary | ICD-10-CM

## 2017-11-14 ENCOUNTER — Ambulatory Visit (INDEPENDENT_AMBULATORY_CARE_PROVIDER_SITE_OTHER): Payer: Medicare Other | Admitting: Psychology

## 2017-11-14 DIAGNOSIS — F4323 Adjustment disorder with mixed anxiety and depressed mood: Secondary | ICD-10-CM

## 2017-11-14 DIAGNOSIS — F4310 Post-traumatic stress disorder, unspecified: Secondary | ICD-10-CM | POA: Diagnosis not present

## 2017-11-17 ENCOUNTER — Encounter: Payer: Self-pay | Admitting: Physician Assistant

## 2017-11-17 DIAGNOSIS — K8020 Calculus of gallbladder without cholecystitis without obstruction: Secondary | ICD-10-CM

## 2017-11-21 NOTE — Telephone Encounter (Signed)
This patient is anxious to set up the surgery visit. I have assured her that the referral is in.

## 2017-11-24 ENCOUNTER — Encounter: Payer: Self-pay | Admitting: *Deleted

## 2017-11-26 DIAGNOSIS — F411 Generalized anxiety disorder: Secondary | ICD-10-CM | POA: Diagnosis not present

## 2017-11-26 DIAGNOSIS — F331 Major depressive disorder, recurrent, moderate: Secondary | ICD-10-CM | POA: Diagnosis not present

## 2017-11-26 DIAGNOSIS — F4311 Post-traumatic stress disorder, acute: Secondary | ICD-10-CM | POA: Diagnosis not present

## 2017-11-26 DIAGNOSIS — G4701 Insomnia due to medical condition: Secondary | ICD-10-CM | POA: Diagnosis not present

## 2017-11-26 DIAGNOSIS — F1211 Cannabis abuse, in remission: Secondary | ICD-10-CM | POA: Diagnosis not present

## 2017-12-09 DIAGNOSIS — F1211 Cannabis abuse, in remission: Secondary | ICD-10-CM | POA: Diagnosis not present

## 2017-12-09 DIAGNOSIS — F331 Major depressive disorder, recurrent, moderate: Secondary | ICD-10-CM | POA: Diagnosis not present

## 2017-12-09 DIAGNOSIS — F411 Generalized anxiety disorder: Secondary | ICD-10-CM | POA: Diagnosis not present

## 2017-12-09 DIAGNOSIS — F4311 Post-traumatic stress disorder, acute: Secondary | ICD-10-CM | POA: Diagnosis not present

## 2017-12-09 DIAGNOSIS — G4701 Insomnia due to medical condition: Secondary | ICD-10-CM | POA: Diagnosis not present

## 2017-12-11 ENCOUNTER — Ambulatory Visit (INDEPENDENT_AMBULATORY_CARE_PROVIDER_SITE_OTHER): Payer: Medicare Other | Admitting: General Surgery

## 2017-12-11 ENCOUNTER — Encounter: Payer: Self-pay | Admitting: General Surgery

## 2017-12-11 VITALS — BP 132/68 | HR 72 | Resp 14 | Ht 61.0 in | Wt 126.0 lb

## 2017-12-11 DIAGNOSIS — K8020 Calculus of gallbladder without cholecystitis without obstruction: Secondary | ICD-10-CM | POA: Diagnosis not present

## 2017-12-11 NOTE — Patient Instructions (Addendum)
The patient is aware to call back for any questions or new concerns.  she will decide and call back  Laparoscopic Cholecystectomy Laparoscopic cholecystectomy is surgery to remove the gallbladder. The gallbladder is a pear-shaped organ that lies beneath the liver on the right side of the body. The gallbladder stores bile, which is a fluid that helps the body to digest fats. Cholecystectomy is often done for inflammation of the gallbladder (cholecystitis). This condition is usually caused by a buildup of gallstones (cholelithiasis) in the gallbladder. Gallstones can block the flow of bile, which can result in inflammation and pain. In severe cases, emergency surgery may be required. This procedure is done though small incisions in your abdomen (laparoscopic surgery). A thin scope with a camera (laparoscope) is inserted through one incision. Thin surgical instruments are inserted through the other incisions. In some cases, a laparoscopic procedure may be turned into a type of surgery that is done through a larger incision (open surgery). Tell a health care provider about:  Any allergies you have.  All medicines you are taking, including vitamins, herbs, eye drops, creams, and over-the-counter medicines.  Any problems you or family members have had with anesthetic medicines.  Any blood disorders you have.  Any surgeries you have had.  Any medical conditions you have.  Whether you are pregnant or may be pregnant. What are the risks? Generally, this is a safe procedure. However, problems may occur, including:  Infection.  Bleeding.  Allergic reactions to medicines.  Damage to other structures or organs.  A stone remaining in the common bile duct. The common bile duct carries bile from the gallbladder into the small intestine.  A bile leak from the cyst duct that is clipped when your gallbladder is removed.  What happens before the procedure? Staying hydrated Follow instructions from  your health care provider about hydration, which may include:  Up to 2 hours before the procedure - you may continue to drink clear liquids, such as water, clear fruit juice, black coffee, and plain tea.  Eating and drinking restrictions Follow instructions from your health care provider about eating and drinking, which may include:  8 hours before the procedure - stop eating heavy meals or foods such as meat, fried foods, or fatty foods.  6 hours before the procedure - stop eating light meals or foods, such as toast or cereal.  6 hours before the procedure - stop drinking milk or drinks that contain milk.  2 hours before the procedure - stop drinking clear liquids.  Medicines  Ask your health care provider about: ? Changing or stopping your regular medicines. This is especially important if you are taking diabetes medicines or blood thinners. ? Taking medicines such as aspirin and ibuprofen. These medicines can thin your blood. Do not take these medicines before your procedure if your health care provider instructs you not to.  You may be given antibiotic medicine to help prevent infection. General instructions  Let your health care provider know if you develop a cold or an infection before surgery.  Plan to have someone take you home from the hospital or clinic.  Ask your health care provider how your surgical site will be marked or identified. What happens during the procedure?  To reduce your risk of infection: ? Your health care team will wash or sanitize their hands. ? Your skin will be washed with soap. ? Hair may be removed from the surgical area.  An IV tube may be inserted into one of  your veins.  You will be given one or more of the following: ? A medicine to help you relax (sedative). ? A medicine to make you fall asleep (general anesthetic).  A breathing tube will be placed in your mouth.  Your surgeon will make several small cuts (incisions) in your  abdomen.  The laparoscope will be inserted through one of the small incisions. The camera on the laparoscope will send images to a TV screen (monitor) in the operating room. This lets your surgeon see inside your abdomen.  Air-like gas will be pumped into your abdomen. This will expand your abdomen to give the surgeon more room to perform the surgery.  Other tools that are needed for the procedure will be inserted through the other incisions. The gallbladder will be removed through one of the incisions.  Your common bile duct may be examined. If stones are found in the common bile duct, they may be removed.  After your gallbladder has been removed, the incisions will be closed with stitches (sutures), staples, or skin glue.  Your incisions may be covered with a bandage (dressing). The procedure may vary among health care providers and hospitals. What happens after the procedure?  Your blood pressure, heart rate, breathing rate, and blood oxygen level will be monitored until the medicines you were given have worn off.  You will be given medicines as needed to control your pain.  Do not drive for 24 hours if you were given a sedative. This information is not intended to replace advice given to you by your health care provider. Make sure you discuss any questions you have with your health care provider. Document Released: 09/23/2005 Document Revised: 04/14/2016 Document Reviewed: 03/11/2016 Elsevier Interactive Patient Education  2018 [employer].

## 2017-12-11 NOTE — Progress Notes (Signed)
Patient ID: Angela Murphy, female   DOB: May 27, 1946, 72 y.o.   MRN: [id number]  Chief Complaint  Patient presents with  . Other    gall stones    HPI Angela Murphy is a 72 y.o. female.  Here for evaluation of gall stones referred by Dr Jacqulynn Cadet. She states she was have upper abdominal "tightness" that prompted her to go to the ED on 10-19-17. No nausea or vomiting. Ultrasound done 10-20-17 showing gallstones. She states the diarrhea "pudding" is not new and only happens occasionally.  She states she has never had this type of pain in the past. 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  HPI  Past Medical History:  Diagnosis Date  . ADHD   . Allergy   . Anemia   . Anxiety   . Arthritis   . Breast cancer ([place])   . 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 Cataract   . Chicken pox   . Cognitive decline   . Depression   . Hypertension   . Hypertriglyceridemia   . Hypothyroidism   . Neuromuscular disorder ([place])    peripheral neuropathy  . Neuropathy   . Post traumatic stress disorder   . Sleep apnea    uses cpap  . Thyroid disease     Past Surgical History:  Procedure Laterality Date  . BREAST BIOPSY Left 12/25/2016   DCIS   . BREAST EXCISIONAL BIOPSY Left 02/07/2017   lumpectomy DCIS   . CATARACT EXTRACTION W/ INTRAOCULAR LENS  IMPLANT, BILATERAL    . COLONOSCOPY    . EYE SURGERY Bilateral   . MASTECTOMY, PARTIAL Left 02/27/2017   Procedure: MASTECTOMY PARTIAL;  Surgeon: Leonie Green, MD;  Location: [facility] ORS;  Service: General;  Laterality: Left;  . PARTIAL MASTECTOMY WITH NEEDLE LOCALIZATION Left 02/07/2017   Procedure: PARTIAL MASTECTOMY WITH NEEDLE LOCALIZATION;  Surgeon: Leonie Green, MD;  Location: [facility] OR[facility];  Service: General;  Laterality: Left;  . SENTINEL NODE BIOPSY Left 02/07/2017   Procedure: SENTINEL NODE BIOPSY;  Surgeon: Leonie Green, MD;  Location: [facility] ORS;  Service: General;  Laterality: Left;  . THYROIDECTOMY,  PARTIAL  1974  . TONSILLECTOMY      Family History  Problem Relation Age of Onset  . Arthritis Mother   . Heart disease Mother   . Heart disease Father   . Mental illness Maternal Uncle   . Arthritis Maternal Grandmother   . Heart disease Maternal Grandmother   . Alcohol abuse Maternal Grandfather   . Heart disease Maternal Grandfather   . Heart disease Paternal Grandmother   . Heart disease Paternal Grandfather     Social History Social History   Tobacco Use  . Smoking status: Former Smoker    Years: 2.00    Last attempt to quit: 10/07/1972    Years since quitting: 45.2  . Smokeless tobacco: Never Used  Substance Use Topics  . Alcohol use: No    Alcohol/week: 0.0 oz    Frequency: Never    Comment: rarely  . Drug use: No    Comment: quit marijuana 04/2017    Allergies  Allergen Reactions  . Other     Seasonal allergies--runny nose, sneezing.  . Statins Other (See Comments)    Awful muscle weakness. Has tried several, doesn't recall which.    Current Outpatient Medications  Medication Sig Dispense Refill  . Calcium Carbonate-Vit D-Min (CALCIUM 600+D3 PLUS MINERALS PO) Take by mouth daily.    Marland Kitchen  DULoxetine (CYMBALTA) 20 MG capsule Take 40 mg by mouth daily.    Marland Kitchen ezetimibe (ZETIA) 10 MG tablet Take 1 tablet (10 mg total) by mouth at bedtime. 90 tablet 1  . fexofenadine (ALLEGRA) 180 MG tablet Take 180 mg by mouth every morning.    Marland Kitchen glucosamine-chondroitin 500-400 MG tablet Take 1 tablet by mouth 2 (two) times daily.    Marland Kitchen ibuprofen (ADVIL,MOTRIN) 200 MG tablet Take 200-400 mg by mouth every 8 (eight) hours as needed (for pain.).    Marland Kitchen levothyroxine (SYNTHROID, LEVOTHROID) 112 MCG tablet TAKE 1 TABLET BY MOUTH ONCE DAILY 90 tablet 3  . lisinopril-hydrochlorothiazide (PRINZIDE,ZESTORETIC) 20-25 MG tablet Take 1 tablet by mouth daily. 90 tablet 3  . Multiple Vitamin (MULTIVITAMIN) capsule Take 1 capsule by mouth daily.    . Omega-3 Fatty Acids (FISH OIL) 500 MG CAPS Take  500 mg by mouth every evening.    . Probiotic Product (PROBIOTIC PO) Take 2 capsules by mouth daily. MEGASPORE PROBIOTICS    . sertraline (ZOLOFT) 50 MG tablet     . traZODone (DESYREL) 50 MG tablet Take 50 mg by mouth at bedtime.    . TURMERIC PO Take by mouth daily.     No current facility-administered medications for this visit.     Review of Systems Review of Systems  Constitutional: Negative.   Respiratory: Negative.   Cardiovascular: Negative.   Gastrointestinal: Positive for diarrhea. Negative for abdominal pain, constipation, nausea and vomiting.    Blood pressure 132/68, pulse 72, resp. rate 14, height 5\' 1"  (1.549 m), weight 126 lb (57.2 kg), SpO2 98 %.  Physical Exam Physical Exam  Constitutional: She is oriented to person, place, and time. She appears well-developed and well-nourished.  HENT:  Mouth/Throat: Oropharynx is clear and moist.  Eyes: Conjunctivae are normal. No scleral icterus.  Neck: Neck supple.  Cardiovascular: Normal rate, regular rhythm, normal heart sounds and intact distal pulses.  Pulmonary/Chest: Effort normal and breath sounds normal.  Abdominal: Soft. Normal appearance and bowel sounds are normal. There is no tenderness.  Lymphadenopathy:    She has no cervical adenopathy.  Neurological: She is alert and oriented to person, place, and time.  Skin: Skin is warm and dry.  Psychiatric: Her behavior is normal.    Data Reviewed Abdominal ultrasound October 20, 2017 obtained through the emergency room showed multiple large gallstones, the largest measuring 2.4 cm.  Normal common bile duct.  Normal liver echo architecture.  CBC of October 19, 2017 showed a hemoglobin 11.7 with an MCV of 87, white blood cell count of 4400.  Lately count of 204,000.  Hepatic function panel of the same date showed [place] modest elevation of the AST is 78, LFTs otherwise normal.  Mild elevation of the serum lipase 63 (upper limit of normal 51).  Electrolytes showed a  scant elevation of blood sugar 116, creatinine 0.98 with an estimated GFR of 57.  Electrolytes unremarkable.  Review of laboratory studies from 8 months ago until present show a steady fall in hemoglobin from 13.5-11.7.  Stable MCV.  Hemoglobin on August 08, 2016: 12.9.  Oncology notes of July 01, 2017 reviewed.  Emergency department notes of April 15, 2017 reviewed: Seen for suicidal ideation.  Discharged home.   Assessment    Cholelithiasis, single episode of cholecystitis.  Mild anemia without microcytic indices.      Plan    Discussed observation vs laparoscopy cholecystectomy, she will decide and call back.  Patient reports she is planning to move  to an [place] community with support services in [place].  She plans to defer any elective surgical intervention until the move.     HPI, Physical Exam, Assessment and Plan have been scribed under the direction and in the presence of Robert Bellow, MD. Karie Fetch, RN  I have completed the exam and reviewed the above documentation for accuracy and completeness.  I agree with the above.  Haematologist has been used and any errors in dictation or transcription are unintentional.  Hervey Ard, M.D., F.A.C.S.  Forest Gleason Palmira Stickle 12/12/2017, 7:19 AM

## 2017-12-12 DIAGNOSIS — K8020 Calculus of gallbladder without cholecystitis without obstruction: Secondary | ICD-10-CM | POA: Insufficient documentation

## 2017-12-19 DIAGNOSIS — D519 Vitamin B12 deficiency anemia, unspecified: Secondary | ICD-10-CM | POA: Diagnosis not present

## 2017-12-19 DIAGNOSIS — F331 Major depressive disorder, recurrent, moderate: Secondary | ICD-10-CM | POA: Diagnosis not present

## 2017-12-24 ENCOUNTER — Other Ambulatory Visit: Payer: Self-pay

## 2017-12-24 ENCOUNTER — Ambulatory Visit
Admission: RE | Admit: 2017-12-24 | Discharge: 2017-12-24 | Disposition: A | Discharge status: Home / Self Care | Payer: Medicare Other | Source: Ambulatory Visit | Attending: Radiation Oncology | Admitting: Radiation Oncology

## 2017-12-24 ENCOUNTER — Encounter: Payer: Self-pay | Admitting: Radiation Oncology

## 2017-12-24 ENCOUNTER — Other Ambulatory Visit: Payer: Self-pay | Admitting: *Deleted

## 2017-12-24 VITALS — BP 118/70 | Temp 97.1°F | Resp 20 | Wt 124.9 lb

## 2017-12-24 DIAGNOSIS — Z170 Estrogen receptor positive status [ER+]: Secondary | ICD-10-CM | POA: Diagnosis not present

## 2017-12-24 DIAGNOSIS — D0512 Intraductal carcinoma in situ of left breast: Secondary | ICD-10-CM

## 2017-12-24 DIAGNOSIS — Z923 Personal history of irradiation: Secondary | ICD-10-CM | POA: Insufficient documentation

## 2017-12-24 NOTE — Progress Notes (Signed)
Radiation Oncology Follow up Note  Name: Angela Murphy   Date:   12/24/2017 MRN:  [id number] DOB: December 01, 1945    This 72 y.o. female presents to the clinic today for 6 month follow-up status post whole breast radiation to her left breast for ER/PR positive ductal carcinoma in situ.  REFERRING PROVIDER: Harrison Mons, PA-C  HPI: patient is a 71 year old female now out 6 months having completed whole breast radiation to her left breast for ER/PR positive ductal carcinoma in situ. She is seen today in routine follow-up and is doing well. She specifically denies breast tenderness cough or bone pain.he is not currently on anti-estrogen therapy will review that with medical oncology. She is also not hadmammograms performed which I am ordering.  COMPLICATIONS OF TREATMENT: none  FOLLOW UP COMPLIANCE: keeps appointments   PHYSICAL EXAM:  BP 118/70   Temp (!) 97.1 F (36.2 C)   Resp 20   Wt 124 lb 14.3 oz (56.7 kg)   BMI 23.60 kg/m  Lungs are clear to A&P cardiac examination essentially unremarkable with regular rate and rhythm. No dominant mass or nodularity is noted in either breast in 2 positions examined. Incision is well-healed. No axillary or supraclavicular adenopathy is appreciated. Cosmetic result is excellent. Well-developed well-nourished patient in NAD. HEENT reveals PERLA, EOMI, discs not visualized.  Oral cavity is clear. No oral mucosal lesions are identified. Neck is clear without evidence of cervical or supraclavicular adenopathy. Lungs are clear to A&P. Cardiac examination is essentially unremarkable with regular rate and rhythm without murmur rub or thrill. Abdomen is benign with no organomegaly or masses noted. Motor sensory and DTR levels are equal and symmetric in the upper and lower extremities. Cranial nerves II through XII are grossly intact. Proprioception is intact. No peripheral adenopathy or edema is identified. No motor or sensory levels are noted. Crude visual  fields are within normal range.  RADIOLOGY RESULTS: bilateral mammograms ordered  PLAN: present time she is doing well we'll check with medical oncology about antiestrogen therapy. I've also ordered bilateral mammograms. I've asked to see her back in 6 months for follow-up. Patient knows to call with any concerns at any time. She is planning to move to [place] retirement village in [place] patient is to call anytime with any concerns.    Noreene Filbert, MD

## 2017-12-26 ENCOUNTER — Ambulatory Visit: Payer: Medicare Other | Admitting: Psychology

## 2017-12-26 ENCOUNTER — Other Ambulatory Visit: Payer: Self-pay | Admitting: *Deleted

## 2017-12-26 DIAGNOSIS — D0512 Intraductal carcinoma in situ of left breast: Secondary | ICD-10-CM

## 2018-01-05 ENCOUNTER — Encounter: Payer: Self-pay | Admitting: General Surgery

## 2018-01-06 ENCOUNTER — Telehealth: Payer: Self-pay | Admitting: General Surgery

## 2018-01-06 NOTE — Telephone Encounter (Signed)
The patient called & will be moving to [facility] on April 18th.She would like for you to refer her to a surgeon in [place] so she can set up an appointment for her gallbladder.

## 2018-01-09 ENCOUNTER — Ambulatory Visit (INDEPENDENT_AMBULATORY_CARE_PROVIDER_SITE_OTHER): Payer: Medicare Other | Admitting: Psychology

## 2018-01-09 DIAGNOSIS — F4323 Adjustment disorder with mixed anxiety and depressed mood: Secondary | ICD-10-CM | POA: Diagnosis not present

## 2018-01-09 DIAGNOSIS — F4310 Post-traumatic stress disorder, unspecified: Secondary | ICD-10-CM

## 2018-01-12 ENCOUNTER — Encounter: Payer: Self-pay | Admitting: Physician Assistant

## 2018-01-13 ENCOUNTER — Telehealth: Payer: Self-pay | Admitting: General Surgery

## 2018-01-13 NOTE — Telephone Encounter (Signed)
I spoke with patient & gave her Dr Jovita Gamma name for a physician in [place] Per Dr Bary Castilla.

## 2018-01-14 ENCOUNTER — Other Ambulatory Visit: Payer: Self-pay | Admitting: Physician Assistant

## 2018-01-15 ENCOUNTER — Ambulatory Visit: Payer: Medicare Other | Admitting: General Surgery

## 2018-02-06 ENCOUNTER — Ambulatory Visit: Payer: Medicare Other | Admitting: Psychology

## 2018-02-10 ENCOUNTER — Other Ambulatory Visit: Payer: Self-pay

## 2018-02-23 DIAGNOSIS — F4311 Post-traumatic stress disorder, acute: Secondary | ICD-10-CM | POA: Diagnosis not present

## 2018-02-23 DIAGNOSIS — F1211 Cannabis abuse, in remission: Secondary | ICD-10-CM | POA: Diagnosis not present

## 2018-02-23 DIAGNOSIS — F331 Major depressive disorder, recurrent, moderate: Secondary | ICD-10-CM | POA: Diagnosis not present

## 2018-02-23 DIAGNOSIS — G4701 Insomnia due to medical condition: Secondary | ICD-10-CM | POA: Diagnosis not present

## 2018-02-23 DIAGNOSIS — F411 Generalized anxiety disorder: Secondary | ICD-10-CM | POA: Diagnosis not present

## 2018-03-03 ENCOUNTER — Encounter: Payer: Self-pay | Admitting: Family Medicine

## 2018-03-19 DIAGNOSIS — K8020 Calculus of gallbladder without cholecystitis without obstruction: Secondary | ICD-10-CM | POA: Diagnosis not present

## 2018-03-20 ENCOUNTER — Telehealth: Payer: Self-pay | Admitting: *Deleted

## 2018-03-20 NOTE — Telephone Encounter (Signed)
Angela Murphy took call today from patient and she has moved to [facility] and she will be seeing md near her house and wants to cancel her appt 6/18 and make no more new appts for her.

## 2018-03-24 ENCOUNTER — Inpatient Hospital Stay: Payer: Medicare Other | Admitting: Oncology

## 2018-03-24 ENCOUNTER — Other Ambulatory Visit: Payer: Self-pay

## 2018-04-21 DIAGNOSIS — I10 Essential (primary) hypertension: Secondary | ICD-10-CM | POA: Diagnosis not present

## 2018-04-21 DIAGNOSIS — G629 Polyneuropathy, unspecified: Secondary | ICD-10-CM | POA: Diagnosis not present

## 2018-04-21 DIAGNOSIS — D0512 Intraductal carcinoma in situ of left breast: Secondary | ICD-10-CM | POA: Diagnosis not present

## 2018-04-21 DIAGNOSIS — E039 Hypothyroidism, unspecified: Secondary | ICD-10-CM | POA: Diagnosis not present

## 2018-04-21 DIAGNOSIS — F329 Major depressive disorder, single episode, unspecified: Secondary | ICD-10-CM | POA: Diagnosis not present

## 2018-04-21 DIAGNOSIS — R4189 Other symptoms and signs involving cognitive functions and awareness: Secondary | ICD-10-CM | POA: Diagnosis not present

## 2018-04-21 DIAGNOSIS — F419 Anxiety disorder, unspecified: Secondary | ICD-10-CM | POA: Diagnosis not present

## 2018-05-26 DIAGNOSIS — Z1231 Encounter for screening mammogram for malignant neoplasm of breast: Secondary | ICD-10-CM | POA: Diagnosis not present

## 2018-05-26 DIAGNOSIS — Z780 Asymptomatic menopausal state: Secondary | ICD-10-CM | POA: Diagnosis not present

## 2018-05-26 DIAGNOSIS — Z0000 Encounter for general adult medical examination without abnormal findings: Secondary | ICD-10-CM | POA: Diagnosis not present

## 2018-05-26 DIAGNOSIS — Z79899 Other long term (current) drug therapy: Secondary | ICD-10-CM | POA: Diagnosis not present

## 2018-05-26 DIAGNOSIS — Z13220 Encounter for screening for lipoid disorders: Secondary | ICD-10-CM | POA: Diagnosis not present

## 2018-05-26 DIAGNOSIS — Z1159 Encounter for screening for other viral diseases: Secondary | ICD-10-CM | POA: Diagnosis not present

## 2018-05-28 DIAGNOSIS — G9009 Other idiopathic peripheral autonomic neuropathy: Secondary | ICD-10-CM | POA: Diagnosis not present

## 2018-05-28 DIAGNOSIS — E785 Hyperlipidemia, unspecified: Secondary | ICD-10-CM | POA: Diagnosis not present

## 2018-05-28 DIAGNOSIS — I10 Essential (primary) hypertension: Secondary | ICD-10-CM | POA: Diagnosis not present

## 2018-05-28 DIAGNOSIS — F4312 Post-traumatic stress disorder, chronic: Secondary | ICD-10-CM | POA: Diagnosis not present

## 2018-05-28 DIAGNOSIS — G3184 Mild cognitive impairment, so stated: Secondary | ICD-10-CM | POA: Diagnosis not present

## 2018-05-28 DIAGNOSIS — F900 Attention-deficit hyperactivity disorder, predominantly inattentive type: Secondary | ICD-10-CM | POA: Diagnosis not present

## 2018-06-01 DIAGNOSIS — D0512 Intraductal carcinoma in situ of left breast: Secondary | ICD-10-CM | POA: Diagnosis not present

## 2018-06-01 DIAGNOSIS — R300 Dysuria: Secondary | ICD-10-CM | POA: Diagnosis not present

## 2018-06-09 DIAGNOSIS — Z853 Personal history of malignant neoplasm of breast: Secondary | ICD-10-CM | POA: Diagnosis not present

## 2018-06-09 DIAGNOSIS — Z780 Asymptomatic menopausal state: Secondary | ICD-10-CM | POA: Diagnosis not present

## 2018-06-09 DIAGNOSIS — M810 Age-related osteoporosis without current pathological fracture: Secondary | ICD-10-CM | POA: Diagnosis not present

## 2018-06-09 DIAGNOSIS — D0512 Intraductal carcinoma in situ of left breast: Secondary | ICD-10-CM | POA: Diagnosis not present

## 2018-07-22 ENCOUNTER — Ambulatory Visit: Payer: Self-pay | Admitting: Radiation Oncology

## 2018-08-11 DIAGNOSIS — G9009 Other idiopathic peripheral autonomic neuropathy: Secondary | ICD-10-CM | POA: Diagnosis not present

## 2018-08-11 DIAGNOSIS — I10 Essential (primary) hypertension: Secondary | ICD-10-CM | POA: Diagnosis not present

## 2018-08-11 DIAGNOSIS — F900 Attention-deficit hyperactivity disorder, predominantly inattentive type: Secondary | ICD-10-CM | POA: Diagnosis not present

## 2018-08-11 DIAGNOSIS — E785 Hyperlipidemia, unspecified: Secondary | ICD-10-CM | POA: Diagnosis not present

## 2018-08-11 DIAGNOSIS — G3184 Mild cognitive impairment, so stated: Secondary | ICD-10-CM | POA: Diagnosis not present

## 2018-08-11 DIAGNOSIS — F4312 Post-traumatic stress disorder, chronic: Secondary | ICD-10-CM | POA: Diagnosis not present

## 2018-08-13 DIAGNOSIS — Z23 Encounter for immunization: Secondary | ICD-10-CM | POA: Diagnosis not present

## 2018-08-17 DIAGNOSIS — E785 Hyperlipidemia, unspecified: Secondary | ICD-10-CM | POA: Diagnosis not present

## 2018-08-17 DIAGNOSIS — R140 Abdominal distension (gaseous): Secondary | ICD-10-CM | POA: Diagnosis not present

## 2018-08-17 DIAGNOSIS — M533 Sacrococcygeal disorders, not elsewhere classified: Secondary | ICD-10-CM | POA: Diagnosis not present

## 2018-09-14 DIAGNOSIS — R140 Abdominal distension (gaseous): Secondary | ICD-10-CM | POA: Diagnosis not present

## 2018-09-17 DIAGNOSIS — Z8739 Personal history of other diseases of the musculoskeletal system and connective tissue: Secondary | ICD-10-CM | POA: Diagnosis not present

## 2018-09-17 DIAGNOSIS — G629 Polyneuropathy, unspecified: Secondary | ICD-10-CM | POA: Diagnosis not present

## 2018-09-17 DIAGNOSIS — R202 Paresthesia of skin: Secondary | ICD-10-CM | POA: Diagnosis not present

## 2018-09-17 DIAGNOSIS — R197 Diarrhea, unspecified: Secondary | ICD-10-CM | POA: Diagnosis not present

## 2018-09-23 DIAGNOSIS — G3184 Mild cognitive impairment, so stated: Secondary | ICD-10-CM | POA: Diagnosis not present

## 2018-09-23 DIAGNOSIS — F900 Attention-deficit hyperactivity disorder, predominantly inattentive type: Secondary | ICD-10-CM | POA: Diagnosis not present

## 2018-09-23 DIAGNOSIS — F4312 Post-traumatic stress disorder, chronic: Secondary | ICD-10-CM | POA: Diagnosis not present

## 2018-09-23 DIAGNOSIS — E785 Hyperlipidemia, unspecified: Secondary | ICD-10-CM | POA: Diagnosis not present

## 2018-09-23 DIAGNOSIS — I10 Essential (primary) hypertension: Secondary | ICD-10-CM | POA: Diagnosis not present

## 2018-09-23 DIAGNOSIS — G9009 Other idiopathic peripheral autonomic neuropathy: Secondary | ICD-10-CM | POA: Diagnosis not present

## 2018-10-13 ENCOUNTER — Ambulatory Visit: Payer: Medicare Other

## 2018-11-05 ENCOUNTER — Telehealth: Payer: Self-pay | Admitting: Internal Medicine

## 2018-11-05 NOTE — Telephone Encounter (Signed)
LMTCB. We need to know if she is going to come by and pick it up or if she wants it mailed.

## 2018-11-06 NOTE — Telephone Encounter (Signed)
Called and left detailed message for Patient to call back.

## 2018-11-09 NOTE — Telephone Encounter (Signed)
Called and spoke with Patient. She requested a copy of her sleep test be faxed to [facility], [phone], and a copy for her PCP, [phone].  She requested a copy be mailed to her home address. Requested faxes sent, confirmation received.  Copy placed in sealed envelope and placed at front desk out going mail.  Nothing further at this time.
